# Patient Record
Sex: Male | Born: 1990 | Race: Black or African American | Hispanic: No | Marital: Single | Smoking: Current some day smoker
Health system: Southern US, Community
[De-identification: ages and names within clinical notes are randomized; demographics above are authoritative.]

## PROBLEM LIST (undated history)

## (undated) DIAGNOSIS — Z8614 Personal history of Methicillin resistant Staphylococcus aureus infection: Secondary | ICD-10-CM

## (undated) DIAGNOSIS — M67439 Ganglion, unspecified wrist: Secondary | ICD-10-CM

## (undated) DIAGNOSIS — Z8489 Family history of other specified conditions: Secondary | ICD-10-CM

## (undated) DIAGNOSIS — K219 Gastro-esophageal reflux disease without esophagitis: Secondary | ICD-10-CM

## (undated) HISTORY — PX: WISDOM TOOTH EXTRACTION: SHX21

## (undated) HISTORY — PX: TONSILLECTOMY AND ADENOIDECTOMY: SHX28

---
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
## 2013-06-22 ENCOUNTER — Ambulatory Visit (INDEPENDENT_AMBULATORY_CARE_PROVIDER_SITE_OTHER): Payer: BC Managed Care – PPO | Admitting: Nurse Practitioner

## 2013-06-22 VITALS — Temp 98.4°F | Ht 69.0 in | Wt 158.6 lb

## 2013-06-22 DIAGNOSIS — H6690 Otitis media, unspecified, unspecified ear: Secondary | ICD-10-CM

## 2013-06-22 DIAGNOSIS — J300 Vasomotor rhinitis: Secondary | ICD-10-CM

## 2013-06-22 DIAGNOSIS — H6692 Otitis media, unspecified, left ear: Secondary | ICD-10-CM

## 2013-06-22 DIAGNOSIS — J309 Allergic rhinitis, unspecified: Secondary | ICD-10-CM

## 2013-06-22 MED ORDER — HYDROCODONE-ACETAMINOPHEN 5-325 MG PO TABS: 1.0000 | ORAL_TABLET | ORAL | Status: DC | PRN

## 2013-06-22 MED ORDER — AZITHROMYCIN 250 MG PO TABS: ORAL_TABLET | ORAL | Status: DC

## 2013-06-22 NOTE — Patient Instructions (Signed)
Nasacort AQ as directed OTC antihistamine 

## 2013-06-24 ENCOUNTER — Encounter: Payer: Self-pay | Admitting: Nurse Practitioner

## 2013-06-24 NOTE — Progress Notes (Signed)
Subjective:  Presents complaints of left ear pain for the past 4 days. No fever. No headache cough runny nose or sore throat. Some pressure and popping at times. Worse with laying down and burping. No drainage from the ear. Has not been swimming.  Objective:   Temp(Src) 98.4 F (36.9 C) (Oral)  Ht 5\' 9"  (1.753 m)  Wt 158 lb 9.6 oz (71.94 kg)  BMI 23.41 kg/m2 NAD. Alert, oriented. Right TM mild clear effusion, no erythema. Left TM a small area of erythema noted towards the upper part of the TM, clear fluid for the remainder. Pharynx injected with clear PND noted. Neck supple with mild soft slightly tender adenopathy on the left. Lungs clear. Heart regular rate rhythm.  Assessment: Otitis media, left  Vasomotor rhinitis  Plan: Meds ordered this encounter  Medications  . azithromycin (ZITHROMAX Z-PAK) 250 MG tablet    Sig: Take 2 tablets (500 mg) on  Day 1,  followed by 1 tablet (250 mg) once daily on Days 2 through 5.    Dispense:  6 each    Refill:  0    Order Specific Question:  Supervising Provider    Answer:  Merlyn Albert [[id number]]  . HYDROcodone-acetaminophen (NORCO/VICODIN) 5-325 MG per tablet    Sig: Take 1 tablet by mouth every 4 (four) hours as needed for pain.    Dispense:  20 tablet    Refill:  0    Order Specific Question:  Supervising Provider    Answer:  Merlyn Albert [[id number]]   OTC meds as directed for congestion. Call back by the end of the week if no improvement, sooner if worse.

## 2013-06-25 ENCOUNTER — Encounter: Payer: Self-pay | Admitting: *Deleted

## 2014-12-07 ENCOUNTER — Encounter: Payer: Self-pay | Admitting: Family Medicine

## 2014-12-07 ENCOUNTER — Ambulatory Visit (INDEPENDENT_AMBULATORY_CARE_PROVIDER_SITE_OTHER): Payer: BC Managed Care – PPO | Admitting: Family Medicine

## 2014-12-07 VITALS — BP 112/60 | Temp 98.8°F | Ht 69.0 in | Wt 155.0 lb

## 2014-12-07 DIAGNOSIS — K2970 Gastritis, unspecified, without bleeding: Secondary | ICD-10-CM

## 2014-12-07 DIAGNOSIS — Z111 Encounter for screening for respiratory tuberculosis: Secondary | ICD-10-CM

## 2014-12-07 DIAGNOSIS — K2990 Gastroduodenitis, unspecified, without bleeding: Secondary | ICD-10-CM

## 2014-12-07 LAB — CBC WITH DIFFERENTIAL/PLATELET
Basophils Absolute: 0 10*3/uL (ref 0.0–0.1)
Basophils Relative: 0 % (ref 0–1)
Eosinophils Absolute: 0 10*3/uL (ref 0.0–0.7)
Eosinophils Relative: 0 % (ref 0–5)
HCT: 43.2 % (ref 39.0–52.0)
Hemoglobin: 14.6 g/dL (ref 13.0–17.0)
Lymphocytes Relative: 22 % (ref 12–46)
Lymphs Abs: 2 10*3/uL (ref 0.7–4.0)
MCH: 31.1 pg (ref 26.0–34.0)
MCHC: 33.8 g/dL (ref 30.0–36.0)
MCV: 91.9 fL (ref 78.0–100.0)
MPV: 11.3 fL (ref 8.6–12.4)
Monocytes Absolute: 0.5 10*3/uL (ref 0.1–1.0)
Monocytes Relative: 6 % (ref 3–12)
Neutro Abs: 6.6 10*3/uL (ref 1.7–7.7)
Neutrophils Relative %: 72 % (ref 43–77)
Platelets: 250 10*3/uL (ref 150–400)
RBC: 4.7 MIL/uL (ref 4.22–5.81)
RDW: 14 % (ref 11.5–15.5)
WBC: 9.1 10*3/uL (ref 4.0–10.5)

## 2014-12-07 LAB — HEPATIC FUNCTION PANEL
ALT: 12 U/L (ref 0–53)
AST: 18 U/L (ref 0–37)
Albumin: 4.9 g/dL (ref 3.5–5.2)
Alkaline Phosphatase: 53 U/L (ref 39–117)
Bilirubin, Direct: 0.1 mg/dL (ref 0.0–0.3)
Indirect Bilirubin: 0.5 mg/dL (ref 0.2–1.2)
Total Bilirubin: 0.6 mg/dL (ref 0.2–1.2)
Total Protein: 7.4 g/dL (ref 6.0–8.3)

## 2014-12-07 LAB — LIPASE: Lipase: 10 U/L (ref 0–75)

## 2014-12-07 MED ORDER — PANTOPRAZOLE SODIUM 40 MG PO TBEC: 40.0000 mg | DELAYED_RELEASE_TABLET | Freq: Every day | ORAL | Status: DC

## 2014-12-07 MED ORDER — TRAMADOL HCL 50 MG PO TABS: 50.0000 mg | ORAL_TABLET | Freq: Four times a day (QID) | ORAL | Status: DC | PRN

## 2014-12-07 NOTE — Progress Notes (Signed)
   Subjective:    Patient ID: Luke [name], male    DOB: 01/25/1991, 24 y.o.   MRN: [id number]  Abdominal Pain This is a new problem. The current episode started in the past 7 days. The quality of the pain is burning. The abdominal pain radiates to the epigastric region. Associated symptoms include nausea. Treatments tried: advil, pepto. The treatment provided mild relief.   Reqesting tb test for work. Form on chart. Pt states he only needs TB section filled out.  Patient does wake him up at night with severe pain and discomfort no vomiting no diarrhea with it.  Review of Systems  Gastrointestinal: Positive for nausea and abdominal pain.   Denies diarrhea denies fever chills sweats.    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 Pain medication. If patient not doing dramatically better within the next 7-10 days he is let us know we will set him up with gastroenterology. He may need EGD possibly need CAT scan patient understands all of this

## 2014-12-07 NOTE — Patient Instructions (Signed)

## 2014-12-09 ENCOUNTER — Encounter: Payer: Self-pay | Admitting: Family Medicine

## 2014-12-09 LAB — TB SKIN TEST
Induration: 0 mm
TB Skin Test: NEGATIVE

## 2014-12-09 NOTE — Progress Notes (Signed)
Dr. Lorin PicketScott spoke with this pt.

## 2014-12-10 ENCOUNTER — Ambulatory Visit: Payer: BC Managed Care – PPO | Admitting: Family Medicine

## 2014-12-29 ENCOUNTER — Encounter: Payer: Self-pay | Admitting: Family Medicine

## 2014-12-29 ENCOUNTER — Telehealth: Payer: Self-pay | Admitting: Family Medicine

## 2014-12-29 MED ORDER — ONDANSETRON HCL 8 MG PO TABS: 8.0000 mg | ORAL_TABLET | Freq: Three times a day (TID) | ORAL | Status: DC | PRN

## 2014-12-29 NOTE — Telephone Encounter (Signed)
Pt has been taking tramadol thinking it was a nausea med Mom states he needs some nausea med as per your conversation  With her earlier this week due to food poisoning     [facility]

## 2014-12-29 NOTE — Telephone Encounter (Signed)
Pt is requesting a work note for this week. Pt will be going back hopefully on Friday.

## 2014-12-29 NOTE — Telephone Encounter (Signed)
Zofran 8 mg 1 3 times a day when necessary nausea, #20, 1 refill, work excuse for this week, follow-up if ongoing troubles

## 2014-12-29 NOTE — Telephone Encounter (Signed)
Medication sent to pharmacy. Mom notified. Please prepare work excuse for mom.

## 2014-12-29 NOTE — Telephone Encounter (Signed)
Letter done

## 2014-12-30 ENCOUNTER — Other Ambulatory Visit: Payer: Self-pay | Admitting: *Deleted

## 2014-12-30 ENCOUNTER — Telehealth: Payer: Self-pay | Admitting: Family Medicine

## 2014-12-30 MED ORDER — PROMETHAZINE HCL 25 MG PO TABS: 25.0000 mg | ORAL_TABLET | Freq: Three times a day (TID) | ORAL | Status: DC | PRN

## 2014-12-30 NOTE — Telephone Encounter (Signed)
Discussed with pt. Med sent to pharm. Pt transferred to front to schedule follow up

## 2014-12-30 NOTE — Telephone Encounter (Signed)
Pt called wanting to speak with Dr. Rock NephewPt said his nausea is getting  Worse despite the new meds he was given yesterday. Pt is wanting To know if there is anything else he can do.

## 2014-12-30 NOTE — Telephone Encounter (Signed)
May add phenergan tab 25 mg one q 8 prn, numb 24 rec f u visit with dr Lorin Picketscott next wk

## 2015-01-04 ENCOUNTER — Encounter: Payer: Self-pay | Admitting: Family Medicine

## 2015-01-04 ENCOUNTER — Ambulatory Visit (INDEPENDENT_AMBULATORY_CARE_PROVIDER_SITE_OTHER): Payer: BC Managed Care – PPO | Admitting: Family Medicine

## 2015-01-04 VITALS — BP 110/70 | Ht 69.0 in | Wt 156.1 lb

## 2015-01-04 DIAGNOSIS — R1010 Upper abdominal pain, unspecified: Secondary | ICD-10-CM

## 2015-01-04 DIAGNOSIS — K2970 Gastritis, unspecified, without bleeding: Secondary | ICD-10-CM | POA: Diagnosis not present

## 2015-01-04 DIAGNOSIS — K2990 Gastroduodenitis, unspecified, without bleeding: Secondary | ICD-10-CM

## 2015-01-04 MED ORDER — PANTOPRAZOLE SODIUM 40 MG PO TBEC: 40.0000 mg | DELAYED_RELEASE_TABLET | Freq: Two times a day (BID) | ORAL | Status: DC

## 2015-01-04 NOTE — Progress Notes (Signed)
   Subjective:    Patient ID: Luke [name], male    DOB: [birth year], 24 y.o.   MRN: [id number]  Abdominal Pain This is a new problem. The current episode started 1 to 4 weeks ago. The problem occurs intermittently. The problem has been unchanged. The pain is located in the generalized abdominal region. The pain is at a severity of 8/10. The pain is moderate. The quality of the pain is aching. The abdominal pain does not radiate. Associated symptoms include diarrhea (occasional), nausea and vomiting. Pertinent negatives include no constipation or fever. Nothing aggravates the pain. The pain is relieved by nothing. He has tried proton pump inhibitors for the symptoms. The treatment provided no relief.   Patient states that he has no other concerns at this time.   patient initially started getting better with the PPI but then he states over the past 10 days pain is returned and wakes him up at 4 AM sometimes 5:30 AM his appetite is fair he denies vomiting frequently denies blood in stool.   Review of Systems  Constitutional: Positive for fatigue. Negative for fever and unexpected weight change.  Respiratory: Negative for cough and choking.   Cardiovascular: Negative for chest pain.  Gastrointestinal: Positive for nausea, vomiting, abdominal pain and diarrhea (occasional). Negative for constipation.       Objective:   Physical Exam  Constitutional: He appears well-nourished. No distress.  Cardiovascular: Normal rate, regular rhythm and normal heart sounds.   No murmur heard. Pulmonary/Chest: Effort normal and breath sounds normal. No respiratory distress.  Abdominal: Soft. There is no tenderness.  Musculoskeletal: He exhibits no edema.  Lymphadenopathy:    He has no cervical adenopathy.  Neurological: He is alert.  Psychiatric: His behavior is normal.  Vitals reviewed.         Assessment & Plan:   reoccurring severe abdominal pain with nocturnal pain-this patient needs EGD. If  EGD does not defined what is going on he may well need a CAT scan as well. Referral to gastroenterology urgent referral. Patient understands why. Increase PPI to twice a day.

## 2015-01-05 LAB — SEDIMENTATION RATE: SED RATE: 1 mm/h (ref 0–15)

## 2015-01-05 LAB — TISSUE TRANSGLUTAMINASE, IGA: Tissue Transglutaminase Ab, IgA: 1 U/mL (ref <4)

## 2015-01-06 ENCOUNTER — Ambulatory Visit (INDEPENDENT_AMBULATORY_CARE_PROVIDER_SITE_OTHER): Payer: BC Managed Care – PPO | Admitting: Nurse Practitioner

## 2015-01-06 ENCOUNTER — Other Ambulatory Visit: Payer: Self-pay

## 2015-01-06 ENCOUNTER — Encounter: Payer: Self-pay | Admitting: Nurse Practitioner

## 2015-01-06 VITALS — BP 131/77 | HR 78 | Temp 97.3°F | Ht 69.0 in | Wt 158.0 lb

## 2015-01-06 DIAGNOSIS — R1013 Epigastric pain: Secondary | ICD-10-CM

## 2015-01-06 MED ORDER — SUCRALFATE 1 GM/10ML PO SUSP: 1.0000 g | Freq: Three times a day (TID) | ORAL | Status: DC | PRN

## 2015-01-06 NOTE — Progress Notes (Signed)
cc'ed to pcp °

## 2015-01-06 NOTE — Assessment & Plan Note (Signed)
Approximate 1 month new onset dyspepsia, strong family history of gastritis and ulcers. Previously took moderate to large amounts of ASA powders and/or NSAIDs. Started on PPI daily for 1 month with no significant improvement, required frequent mylanta. PPI doubled to bid yesterday. Will add carafate for severe symptoms and plan for EGD for further evaluation query gastritis/esophagitis/H. Pylori. No red flag/warning symptoms. Follow-up 1 month.

## 2015-01-06 NOTE — Progress Notes (Signed)
Primary Care Physician:  Lilyan PuntLUKING,SCOTT, MD Primary Gastroenterologist:  Dr. Darrick PennaFields  Chief Complaint  Patient presents with  . Follow-up    eval for EGD  . Nausea    HPI:   24 year old male with new onset epigastric pain referred by his PCP. Was trialed on PPI daily with initial improvement but subsequent recurrence. PPI was changed to twice a day and referred for further evaluation of nocturnal epigastric pain that wakens patient at night.  Today he states that his pain is epigastric. He had some brief episodic pain which started 3 weeks ago and became more frequent and longer-lasting. At this point his pain would become severe (7/10) and last for a few days. Pain is described as burning with concurrent nausea. Did vomit the first day, otherwise no emesis just gagging. The pain is bad enough to wake him from his sleep at times and keep him awake. Denies bitter taste, admits frequent belching. Even when he's not having the burning pain he is nauseated. Prior to onset of symptoms was using NSAIDs and ASA powders for headache but has since stopped. States he used to use ASA powder frequently. His grandparents had significant ulcers and his mom has gastritis. Denies hematemesis. Denies hematochezia and melena. Started PPI daily for about 30 days, which was doubled yesterday at his PCP visit. Was taking pepto bismol without relief. Also tried Mylanta which helped but he was needed to take a lot of it. Denies unexplained fever/chills, unintentional weight loss, chest pain, shortness of breath, palpitations, syncope or near syncope. Denies heavy caffeine, carbonated beverage use. Drinks primarily fruit juices. Denies any other upper or lower GI symptoms.    Past Medical History  Diagnosis Date  . Stomach pain     Chronic    Past Surgical History  Procedure Laterality Date  . Tonsillectomy      Current Outpatient Prescriptions  Medication Sig Dispense Refill  . ondansetron (ZOFRAN) 8 MG  tablet Take 1 tablet (8 mg total) by mouth 3 (three) times daily as needed for nausea or vomiting. 20 tablet 1  . pantoprazole (PROTONIX) 40 MG tablet Take 1 tablet (40 mg total) by mouth 2 (two) times daily. 60 tablet 3  . promethazine (PHENERGAN) 25 MG tablet Take 1 tablet (25 mg total) by mouth every 8 (eight) hours as needed for nausea or vomiting. 24 tablet 0  . traMADol (ULTRAM) 50 MG tablet Take 1 tablet (50 mg total) by mouth every 6 (six) hours as needed. 30 tablet 0   No current facility-administered medications for this visit.    Allergies as of 01/06/2015 - Review Complete 01/06/2015  Allergen Reaction Noted  . Penicillins  06/22/2013    No family history on file.  History   Social History  . Marital Status: Single    Spouse Name: N/A  . Number of Children: N/A  . Years of Education: N/A   Occupational History  . Not on file.   Social History Main Topics  . Smoking status: Never Smoker   . Smokeless tobacco: Not on file  . Alcohol Use: Not on file  . Drug Use: Not on file  . Sexual Activity: Not on file   Other Topics Concern  . Not on file   Social History Narrative    Review of Systems: 12 point ROS negative except as per HPI  Physical Exam: BP 131/77 mmHg  Pulse 78  Temp(Src) 97.3 F (36.3 C)  Ht 5\' 9"  (1.753 m)  Wt 158 lb (71.668 kg)  BMI 23.32 kg/m2 General:   Alert and oriented. Pleasant and cooperative. Well-nourished and well-developed.  Head:  Normocephalic and atraumatic. Eyes:  Without icterus, sclera clear and conjunctiva pink.  Ears:  Normal auditory acuity. Mouth:  No deformity or lesions, oral mucosa pink. No OP edema Neck:  Supple, without mass or thyromegaly. Lungs:  Clear to auscultation bilaterally. No wheezes, rales, or rhonchi. No distress.  Heart:  S1, S2 present without murmurs appreciated.  Abdomen:  +BS, soft, non-tender and non-distended. No HSM noted. No guarding or rebound. No masses appreciated.  Rectal:  Deferred    Msk:  Symmetrical without gross deformities. Normal posture. Extremities:  Without clubbing or edema. Neurologic:  Alert and  oriented x4;  grossly normal neurologically. Skin:  Intact without significant lesions or rashes. Cervical Nodes:  No significant cervical adenopathy. Psych:  Alert and cooperative. Normal mood and affect.     01/06/2015 8:30 AM

## 2015-01-06 NOTE — Patient Instructions (Addendum)
Continue taking the protonix  Take the carafate liquid 1 gm three times daily as needed for severe symptoms  We will schedule your endoscopy for you  Further recommendations to be based on the results of your procedure  Follow-up in 1 month   Gastroesophageal Reflux Disease, Adult Gastroesophageal reflux disease (GERD) happens when acid from your stomach flows up into the esophagus. When acid comes in contact with the esophagus, the acid causes soreness (inflammation) in the esophagus. Over time, GERD may create small holes (ulcers) in the lining of the esophagus. CAUSES   Increased body weight. This puts pressure on the stomach, making acid rise from the stomach into the esophagus.  Smoking. This increases acid production in the stomach.  Drinking alcohol. This causes decreased pressure in the lower esophageal sphincter (valve or ring of muscle between the esophagus and stomach), allowing acid from the stomach into the esophagus.  Late evening meals and a full stomach. This increases pressure and acid production in the stomach.  A malformed lower esophageal sphincter. Sometimes, no cause is found. SYMPTOMS   Burning pain in the lower part of the mid-chest behind the breastbone and in the mid-stomach area. This may occur twice a week or more often.  Trouble swallowing.  Sore throat.  Dry cough.  Asthma-like symptoms including chest tightness, shortness of breath, or wheezing. DIAGNOSIS  Your caregiver may be able to diagnose GERD based on your symptoms. In some cases, X-rays and other tests may be done to check for complications or to check the condition of your stomach and esophagus. TREATMENT  Your caregiver may recommend over-the-counter or prescription medicines to help decrease acid production. Ask your caregiver before starting or adding any new medicines.  HOME CARE INSTRUCTIONS   Change the factors that you can control. Ask your caregiver for guidance concerning  weight loss, quitting smoking, and alcohol consumption.  Avoid foods and drinks that make your symptoms worse, such as:  Caffeine or alcoholic drinks.  Chocolate.  Peppermint or mint flavorings.  Garlic and onions.  Spicy foods.  Citrus fruits, such as oranges, lemons, or limes.  Tomato-based foods such as sauce, chili, salsa, and pizza.  Fried and fatty foods.  Avoid lying down for the 3 hours prior to your bedtime or prior to taking a nap.  Eat small, frequent meals instead of large meals.  Wear loose-fitting clothing. Do not wear anything tight around your waist that causes pressure on your stomach.  Raise the head of your bed 6 to 8 inches with wood blocks to help you sleep. Extra pillows will not help.  Only take over-the-counter or prescription medicines for pain, discomfort, or fever as directed by your caregiver.  Do not take aspirin, ibuprofen, or other nonsteroidal anti-inflammatory drugs (NSAIDs). SEEK IMMEDIATE MEDICAL CARE IF:   You have pain in your arms, neck, jaw, teeth, or back.  Your pain increases or changes in intensity or duration.  You develop nausea, vomiting, or sweating (diaphoresis).  You develop shortness of breath, or you faint.  Your vomit is green, yellow, black, or looks like coffee grounds or blood.  Your stool is red, bloody, or black. These symptoms could be signs of other problems, such as heart disease, gastric bleeding, or esophageal bleeding. MAKE SURE YOU:   Understand these instructions.  Will watch your condition.  Will get help right away if you are not doing well or get worse. Document Released: 08/01/2005 Document Revised: 01/14/2012 Document Reviewed: 05/11/2011 ExitCare Patient Information 2015  [employer]. This information is not intended to replace advice given to you by your health care provider. Make sure you discuss any questions you have with your health care provider.   Food Choices for Gastroesophageal  Reflux Disease When you have gastroesophageal reflux disease (GERD), the foods you eat and your eating habits are very important. Choosing the right foods can help ease the discomfort of GERD. WHAT GENERAL GUIDELINES DO I NEED TO FOLLOW?  Choose fruits, vegetables, whole grains, low-fat dairy products, and low-fat meat, fish, and poultry.  Limit fats such as oils, salad dressings, butter, nuts, and avocado.  Keep a food diary to identify foods that cause symptoms.  Avoid foods that cause reflux. These may be different for different people.  Eat frequent small meals instead of three large meals each day.  Eat your meals slowly, in a relaxed setting.  Limit fried foods.  Cook foods using methods other than frying.  Avoid drinking alcohol.  Avoid drinking large amounts of liquids with your meals.  Avoid bending over or lying down until 2-3 hours after eating. WHAT FOODS ARE NOT RECOMMENDED? The following are some foods and drinks that may worsen your symptoms: Vegetables Tomatoes. Tomato juice. Tomato and spaghetti sauce. Chili peppers. Onion and garlic. Horseradish. Fruits Oranges, grapefruit, and lemon (fruit and juice). Meats High-fat meats, fish, and poultry. This includes hot dogs, ribs, ham, sausage, salami, and bacon. Dairy Whole milk and chocolate milk. Sour cream. Cream. Butter. Ice cream. Cream cheese.  Beverages Coffee and tea, with or without caffeine. Carbonated beverages or energy drinks. Condiments Hot sauce. Barbecue sauce.  Sweets/Desserts Chocolate and cocoa. Donuts. Peppermint and spearmint. Fats and Oils High-fat foods, including [place] fries and potato chips. Other Vinegar. Strong spices, such as black pepper, white pepper, red pepper, cayenne, curry powder, cloves, ginger, and chili powder. The items listed above may not be a complete list of foods and beverages to avoid. Contact your dietitian for more information. Document Released: 10/22/2005  Document Revised: 10/27/2013 Document Reviewed: 08/26/2013 [facility] Patient Information 2015 [place], [place]. This information is not intended to replace advice given to you by your health care provider. Make sure you discuss any questions you have with your health care provider.

## 2015-01-11 ENCOUNTER — Encounter (HOSPITAL_COMMUNITY)
Admission: RE | Disposition: A | Discharge status: Home / Self Care | Payer: Self-pay | Source: Ambulatory Visit | Attending: Gastroenterology

## 2015-01-11 ENCOUNTER — Encounter (HOSPITAL_COMMUNITY): Payer: Self-pay | Admitting: *Deleted

## 2015-01-11 ENCOUNTER — Ambulatory Visit (HOSPITAL_COMMUNITY)
Admission: RE | Admit: 2015-01-11 | Discharge: 2015-01-11 | Disposition: A | Discharge status: Home / Self Care | Payer: BC Managed Care – PPO | Source: Ambulatory Visit | Attending: Gastroenterology | Admitting: Gastroenterology

## 2015-01-11 DIAGNOSIS — K2970 Gastritis, unspecified, without bleeding: Secondary | ICD-10-CM | POA: Insufficient documentation

## 2015-01-11 DIAGNOSIS — R1013 Epigastric pain: Secondary | ICD-10-CM | POA: Diagnosis present

## 2015-01-11 HISTORY — PX: ESOPHAGOGASTRODUODENOSCOPY: SHX5428

## 2015-01-11 SURGERY — EGD (ESOPHAGOGASTRODUODENOSCOPY)
Anesthesia: Moderate Sedation

## 2015-01-11 MED ORDER — SODIUM CHLORIDE 0.9 % IV SOLN
INTRAVENOUS | Status: DC
  Administered 2015-01-11: 1000 mL via INTRAVENOUS

## 2015-01-11 MED ORDER — MEPERIDINE HCL 100 MG/ML IJ SOLN
INTRAMUSCULAR | Status: AC
  Filled 2015-01-11: qty 2

## 2015-01-11 MED ORDER — MIDAZOLAM HCL 5 MG/5ML IJ SOLN
INTRAMUSCULAR | Status: AC
  Filled 2015-01-11: qty 10

## 2015-01-11 MED ORDER — MEPERIDINE HCL 100 MG/ML IJ SOLN
INTRAMUSCULAR | Status: DC | PRN
  Administered 2015-01-11: 25 mg via INTRAVENOUS
  Administered 2015-01-11: 50 mg via INTRAVENOUS
  Administered 2015-01-11: 25 mg via INTRAVENOUS

## 2015-01-11 MED ORDER — MIDAZOLAM HCL 5 MG/5ML IJ SOLN
INTRAMUSCULAR | Status: DC | PRN
  Administered 2015-01-11 (×4): 2 mg via INTRAVENOUS

## 2015-01-11 MED ORDER — SODIUM CHLORIDE 0.9 % IJ SOLN
INTRAMUSCULAR | Status: AC
  Filled 2015-01-11: qty 3

## 2015-01-11 MED ORDER — PROMETHAZINE HCL 25 MG/ML IJ SOLN
INTRAMUSCULAR | Status: AC
  Filled 2015-01-11: qty 1

## 2015-01-11 MED ORDER — STERILE WATER FOR IRRIGATION IR SOLN
Status: DC | PRN
  Administered 2015-01-11: 14:00:00

## 2015-01-11 MED ORDER — LIDOCAINE VISCOUS 2 % MT SOLN
OROMUCOSAL | Status: AC
  Filled 2015-01-11: qty 15

## 2015-01-11 MED ORDER — PROMETHAZINE HCL 25 MG/ML IJ SOLN
12.5000 mg | Freq: Once | INTRAMUSCULAR | Status: AC
  Administered 2015-01-11: 12.5 mg via INTRAVENOUS

## 2015-01-11 NOTE — H&P (Signed)
  Primary Care Physician:  Lilyan PuntLUKING,SCOTT, MD Primary Gastroenterologist:  Dr. Darrick PennaFields  Pre-Procedure History & Physical: HPI:  Luke [name] is a 24 y.o. male here for DYSPEPSIA.  Past Medical History  Diagnosis Date  . Stomach pain     Chronic    Past Surgical History  Procedure Laterality Date  . Tonsillectomy    . Wisdom tooth extraction      Prior to Admission medications   Medication Sig Start Date End Date Taking? Authorizing Provider  ondansetron (ZOFRAN) 8 MG tablet Take 1 tablet (8 mg total) by mouth 3 (three) times daily as needed for nausea or vomiting. 12/29/14  Yes Babs SciaraScott A Luking, MD  pantoprazole (PROTONIX) 40 MG tablet Take 1 tablet (40 mg total) by mouth 2 (two) times daily. 01/04/15  Yes Babs SciaraScott A Luking, MD  promethazine (PHENERGAN) 25 MG tablet Take 1 tablet (25 mg total) by mouth every 8 (eight) hours as needed for nausea or vomiting. 12/30/14  Yes Merlyn AlbertWilliam S Luking, MD  sucralfate (CARAFATE) 1 GM/10ML suspension Take 10 mLs (1 g total) by mouth 3 (three) times daily as needed. 01/06/15  Yes Anice PaganiniEric A Gill, NP  traMADol (ULTRAM) 50 MG tablet Take 1 tablet (50 mg total) by mouth every 6 (six) hours as needed. 12/07/14  Yes Babs SciaraScott A Luking, MD    Allergies as of 01/06/2015 - Review Complete 01/06/2015  Allergen Reaction Noted  . Penicillins  06/22/2013    Family History  Problem Relation Age of Onset  . Ulcers Maternal Grandfather   . Ulcers Maternal Grandmother   . Esophagitis Mother     History   Social History  . Marital Status: Single    Spouse Name: N/A  . Number of Children: N/A  . Years of Education: N/A   Occupational History  . Not on file.   Social History Main Topics  . Smoking status: Never Smoker   . Smokeless tobacco: Not on file  . Alcohol Use: No  . Drug Use: No  . Sexual Activity: Not on file   Other Topics Concern  . Not on file   Social History Narrative    Review of Systems: See HPI, otherwise negative ROS   Physical Exam: BP  111/67 mmHg  Pulse 71  Temp(Src) 98 F (36.7 C)  Resp 16  SpO2 99% General:   Alert,  pleasant and cooperative in NAD Head:  Normocephalic and atraumatic. Neck:  Supple; Lungs:  Clear throughout to auscultation.    Heart:  Regular rate and rhythm. Abdomen:  Soft, nontender and nondistended. Normal bowel sounds, without guarding, and without rebound.   Neurologic:  Alert and  oriented x4;  grossly normal neurologically.  Impression/Plan:   DYSPEPSIA  PLAN:  EGD TODAY

## 2015-01-11 NOTE — Op Note (Signed)
[facility] [address] [place], [id number]   ENDOSCOPY PROCEDURE REPORT  PATIENT: Luke [name], Luke [name]  MR#: [id number] BIRTHDATE: [birth year] , 23  yrs. old GENDER: male  ENDOSCOPIST: West BaliSandi L Taden Witter, MD REFERRED GN:FAOZHBY:Scott Gerda DissLuking, M.D. PROCEDURE DATE: 01/11/2015 PROCEDURE:   EGD w/ biopsy  INDICATIONS:dyspepsia. USED TO USE AS PRODUCTS. NOW TAKING PROTONIX. HELPS BUT Sx NOT RESOLVED. MEDICATIONS: Demerol 100 mg IV, Versed 8 mg IV, and Promethazine (Phenergan) 12.5 mg IV TOPICAL ANESTHETIC:   Viscous Xylocaine ASA CLASS:  DESCRIPTION OF PROCEDURE:     Physical exam was performed.  Informed consent was obtained from the patient after explaining the benefits, risks, and alternatives to the procedure.  The patient was connected to the monitor and placed in the left lateral position.  Continuous oxygen was provided by nasal cannula and IV medicine administered through an indwelling cannula.  After administration of sedation, the patients esophagus was intubated and the EG-[id number] ([id number]  endoscope was advanced under direct visualization to the second portion of the duodenum.  The scope was removed slowly by carefully examining the color, texture, anatomy, and integrity of the mucosa on the way out.  The patient was recovered in endoscopy and discharged home in satisfactory condition.   ESOPHAGUS: The mucosa of the esophagus appeared normal.   STOMACH: Mild erosive gastritis (inflammation) was found in the gastric antrum.  Multiple biopsies were performed using cold forceps. DUODENUM: The duodenal mucosa showed no abnormalities in the bulb and 2nd part of the duodenum.  Cold forceps biopsies were taken in the bulb and second portion. COMPLICATIONS: There were no immediate complications.  ENDOSCOPIC IMPRESSION: 1.   NAUSEA AND EPIGASTRIC PAIN MOST LIKELY DUE TO GERD/GASTRITIS 2.   MILD Erosive gastritis  RECOMMENDATIONS: CONTINUE PROTONIX.  TAKE 30 MINUTES  PRIOR TO BREAKFAST. FOLLOW [name] LOW FAT DIET. AWAIT BIOPSY FOLLOW UP IN MAY 2016.  REPEAT EXAM:   _______________________________ eSignedWest Bali:  Placido Hangartner L Jodee Wagenaar, MD 01/11/2015 3:16 PM     CPT CODES: ICD CODES:  The ICD and CPT codes recommended by this software are interpretations from the data that the clinical staff has captured with the software.  The verification of the translation of this report to the ICD and CPT codes and modifiers is the sole responsibility of the health care institution and practicing physician where this report was generated.  [employer] will not be held responsible for the validity of the ICD and CPT codes included on this report.  AMA assumes no liability for data contained or not contained herein. CPT is [name] rights managerregistered trademark of the [employer].

## 2015-01-11 NOTE — Discharge Instructions (Signed)
YOUR NAUSEA AND ABDOMINAL PAIN ARE MOST LIKELY DUE TO GASTRITIS AND REFLUX. Your gastritis is most likely DUE TO YOUR USING ASPIRIN PRODUCTS. I biopsied your stomach & DUODENUM.   CONTINUE PROTONIX. TAKE 30 MINUTES PRIOR TO BREAKFAST.  FOLLOW A LOW FAT DIET. SEE INFO BELOW.  YOUR BIOPSY RESULTS WILL BE AVAILABLE IN MY CHART AFTER MAR 11  OR MY OFFICE WILL CONTACT YOU IN 10-14 DAYS WITH YOUR RESULTS.   FOLLOW UP IN MAY 2016.   UPPER ENDOSCOPY AFTER CARE Read the instructions outlined below and refer to this sheet in the next week. These discharge instructions provide you with general information on caring for yourself after you leave the hospital. While your treatment has been planned according to the most current medical practices available, unavoidable complications occasionally occur. If you have any problems or questions after discharge, call DR. Dewel Lotter, [phone].  ACTIVITY  You may resume your regular activity, but move at a slower pace for the next 24 hours.   Take frequent rest periods for the next 24 hours.   Walking will help get rid of the air and reduce the bloated feeling in your belly (abdomen).   No driving for 24 hours (because of the medicine (anesthesia) used during the test).   You may shower.   Do not sign any important legal documents or operate any machinery for 24 hours (because of the anesthesia used during the test).    NUTRITION  Drink plenty of fluids.   You may resume your normal diet as instructed by your doctor.   Begin with a light meal and progress to your normal diet. Heavy or fried foods are harder to digest and may make you feel sick to your stomach (nauseated).   Avoid alcoholic beverages for 24 hours or as instructed.    MEDICATIONS  You may resume your normal medications.   WHAT YOU CAN EXPECT TODAY  Some feelings of bloating in the abdomen.   Passage of more gas than usual.    IF YOU HAD A BIOPSY TAKEN DURING THE UPPER  ENDOSCOPY:  Eat a soft diet IF YOU HAVE NAUSEA, BLOATING, ABDOMINAL PAIN, OR VOMITING.    FINDING OUT THE RESULTS OF YOUR TEST Not all test results are available during your visit. DR. Darrick Penna WILL CALL YOU WITHIN 14 DAYS OF YOUR PROCEDUE WITH YOUR RESULTS. Do not assume everything is normal if you have not heard from DR. Vonna Brabson, CALL HER OFFICE AT [phone].  SEEK IMMEDIATE MEDICAL ATTENTION AND CALL THE OFFICE: [phone] IF:  You have more than a spotting of blood in your stool.   Your belly is swollen (abdominal distention).   You are nauseated or vomiting.   You have a temperature over 101F.   You have abdominal pain or discomfort that is severe or gets worse throughout the day.   Gastritis  Gastritis is an inflammation (the body's way of reacting to injury and/or infection) of the stomach.  It is often caused by bacterial (germ) infections. It can also be caused BY ASPIRIN, BC/GOODY POWDER'S, (IBUPROFEN) MOTRIN, OR ALEVE (NAPROXEN), chemicals (including alcohol), SPICY FOODS, and medications. This illness may be associated with generalized malaise (feeling tired, not well), UPPER ABDOMINAL STOMACH cramps, and fever. One common bacterial cause of gastritis is an organism known as H. Pylori. This can be treated with antibiotics.     REFLUX   TREATMENT There are a number of medicines used to treat reflux including: Antacids.  ZANTAC Proton-pump inhibitors: PROTONIX  HOME  CARE INSTRUCTIONS Eat 2-3 hours before going to bed.  Try to reach and maintain a healthy weight. LOSE 10-20 LBS Do not eat just a few very large meals. Instead, eat 4 TO 6 smaller meals throughout the day.  Try to identify foods and beverages that make your symptoms worse, and avoid these.  Avoid tight clothing.  Do not exercise right after eating.   Low-Fat Diet BREADS, CEREALS, PASTA, RICE, DRIED PEAS, AND BEANS These products are high in carbohydrates and most are low in fat. Therefore, they  can be increased in the diet as substitutes for fatty foods. They too, however, contain calories and should not be eaten in excess. Cereals can be eaten for snacks as well as for breakfast.  Include foods that contain fiber (fruits, vegetables, whole grains, and legumes). Research shows that fiber may lower blood cholesterol levels, especially the water-soluble fiber found in fruits, vegetables, oat products, and legumes. FRUITS AND VEGETABLES It is good to eat fruits and vegetables. Besides being sources of fiber, both are rich in vitamins and some minerals. They help you get the daily allowances of these nutrients. Fruits and vegetables can be used for snacks and desserts. MEATS Limit lean meat, chicken, [place], and fish to no more than 6 ounces per day. Beef, Pork, and Lamb Use lean cuts of beef, pork, and lamb. Lean cuts include:  Extra-lean ground beef.  Arm roast.  Sirloin tip.  Center-cut ham.  Round steak.  Loin chops.  Rump roast.  Tenderloin.  Trim all fat off the outside of meats before cooking. It is not necessary to severely decrease the intake of red meat, but lean choices should be made. Lean meat is rich in protein and contains a highly absorbable form of iron. Premenopausal women, in particular, should avoid reducing lean red meat because this could increase the risk for low red blood cells (iron-deficiency anemia).  Chicken and [place] These are good sources of protein. The fat of poultry can be reduced by removing the skin and underlying fat layers before cooking. Chicken and [place] can be substituted for lean red meat in the diet. Poultry should not be fried or covered with high-fat sauces. Fish and Shellfish Fish is a good source of protein. Shellfish contain cholesterol, but they usually are low in saturated fatty acids. The preparation of fish is important. Like chicken and [place], they should not be fried or covered with high-fat sauces. EGGS Egg whites contain no fat or  cholesterol. They can be eaten often. Try 1 to 2 egg whites instead of whole eggs in recipes or use egg substitutes that do not contain yolk.  MILK AND DAIRY PRODUCTS Use skim or 1% milk instead of 2% or whole milk. Decrease whole milk, natural, and processed cheeses. Use nonfat or low-fat (2%) cottage cheese or low-fat cheeses made from vegetable oils. Choose nonfat or low-fat (1 to 2%) yogurt. Experiment with evaporated skim milk in recipes that call for heavy cream. Substitute low-fat yogurt or low-fat cottage cheese for sour cream in dips and salad dressings. Have at least 2 servings of low-fat dairy products, such as 2 glasses of skim (or 1%) milk each day to help get your daily calcium intake.  FATS AND OILS Butterfat, lard, and beef fats are high in saturated fat and cholesterol. These should be avoided.Vegetable fats do not contain cholesterol. AVOID coconut oil, palm oil, and palm kernel oil, WHICH are very high in saturated fats. These should be limited. These fats are often  used in [employer] goods, processed foods, popcorn, oils, and nondairy creamers. Vegetable shortenings and some peanut butters contain hydrogenated oils, which are also saturated fats. Read the labels on these foods and check for saturated vegetable oils.  Desirable liquid vegetable oils are corn oil, cottonseed oil, olive oil, canola oil, safflower oil, soybean oil, and sunflower oil. Peanut oil is not as good, but small amounts are acceptable. Buy a heart-healthy tub margarine that has no partially hydrogenated oils in the ingredients. AVOID Mayonnaise and salad dressings often are made from unsaturated fats.  OTHER EATING TIPS Snacks  Most sweets should be limited as snacks. They tend to be rich in calories and fats, and their caloric content outweighs their nutritional value. Some good choices in snacks are graham crackers, melba toast, soda crackers, bagels (no egg), English muffins, fruits, and vegetables. These snacks  are preferable to snack crackers, [place] fries, and chips. Popcorn should be air-popped or cooked in small amounts of liquid vegetable oil.  Desserts Eat fruit, low-fat yogurt, and fruit ices instead of pastries, cake, and cookies. Sherbet, angel food cake, gelatin dessert, frozen low-fat yogurt, or other frozen products that do not contain saturated fat (pure fruit juice bars, frozen ice pops) are also acceptable.   COOKING METHODS Choose those methods that use little or no fat. They include: Poaching.  Braising.  Steaming.  Grilling.  Baking.  Stir-frying.  Broiling.  Microwaving.  Foods can be cooked in a nonstick pan without added fat, or use a nonfat cooking spray in regular cookware. Limit fried foods and avoid frying in saturated fat. Add moisture to lean meats by using water, broth, cooking wines, and other nonfat or low-fat sauces along with the cooking methods mentioned above. Soups and stews should be chilled after cooking. The fat that forms on top after a few hours in the refrigerator should be skimmed off. When preparing meals, avoid using excess salt. Salt can contribute to raising blood pressure in some people.  EATING AWAY FROM HOME Order entres, potatoes, and vegetables without sauces or butter. When meat exceeds the size of a deck of cards (3 to 4 ounces), the rest can be taken home for another meal. Choose vegetable or fruit salads and ask for low-calorie salad dressings to be served on the side. Use dressings sparingly. Limit high-fat toppings, such as bacon, crumbled eggs, cheese, sunflower seeds, and olives. Ask for heart-healthy tub margarine instead of butter.

## 2015-01-11 NOTE — Progress Notes (Signed)
REVIEWED-NO ADDITIONAL RECOMMENDATIONS. 

## 2015-01-12 ENCOUNTER — Encounter (HOSPITAL_COMMUNITY): Payer: Self-pay | Admitting: Gastroenterology

## 2015-01-26 ENCOUNTER — Telehealth: Payer: Self-pay | Admitting: Gastroenterology

## 2015-01-26 NOTE — Telephone Encounter (Signed)
Please call pt. His stomach Bx shows gastritis.Marland Kitchen. HIS SMALL BOWEL BIOPSIES ARE NORMAL. HIS  NAUSEA AND ABDOMINAL PAIN ARE MOST LIKELY DUE TO GASTRITIS AND REFLUX. HIS gastritis is most likely DUE TO YOUR USING ASPIRIN PRODUCTS. I   CONTINUE PROTONIX. TAKE 30 MINUTES PRIOR TO BREAKFAST.  FOLLOW A LOW FAT DIET.   IF HIS SYMPTOMS HAVE NOT COMPLETELY RESOLVED, HE NEEDS A CT ABD W/ IVC, Dx: NAUSEA, EPIGASTRIC PAIN.  FOLLOW UP IN MAY 2016 E30 NAUSEA/ABDOMINAL PAIN..Marland Kitchen

## 2015-01-27 NOTE — Telephone Encounter (Signed)
LM for pt to call

## 2015-01-27 NOTE — Telephone Encounter (Signed)
OV made °

## 2015-01-31 ENCOUNTER — Telehealth: Payer: Self-pay | Admitting: Gastroenterology

## 2015-01-31 NOTE — Telephone Encounter (Signed)
Pt had left VM and I returned his call and LMOM for a call back.

## 2015-01-31 NOTE — Telephone Encounter (Signed)
Pt is aware of results. Luke [name] he is feeling much better. Will call if he has problems.

## 2015-01-31 NOTE — Telephone Encounter (Signed)
[phone] PATIENT RETURNED CALL, PLEASE CALL BACK

## 2015-01-31 NOTE — Telephone Encounter (Signed)
See results. Pt is aware.  

## 2015-02-10 ENCOUNTER — Ambulatory Visit: Payer: BC Managed Care – PPO | Admitting: Nurse Practitioner

## 2015-03-02 ENCOUNTER — Encounter: Payer: Self-pay | Admitting: Family Medicine

## 2015-03-02 ENCOUNTER — Ambulatory Visit (INDEPENDENT_AMBULATORY_CARE_PROVIDER_SITE_OTHER): Payer: BC Managed Care – PPO | Admitting: Family Medicine

## 2015-03-02 VITALS — BP 122/78 | Temp 99.0°F | Ht 69.0 in | Wt 154.5 lb

## 2015-03-02 DIAGNOSIS — J019 Acute sinusitis, unspecified: Secondary | ICD-10-CM

## 2015-03-02 DIAGNOSIS — R6889 Other general symptoms and signs: Secondary | ICD-10-CM

## 2015-03-02 DIAGNOSIS — B9689 Other specified bacterial agents as the cause of diseases classified elsewhere: Secondary | ICD-10-CM

## 2015-03-02 MED ORDER — AZITHROMYCIN 250 MG PO TABS: ORAL_TABLET | ORAL | Status: DC

## 2015-03-02 MED ORDER — HYDROCODONE-HOMATROPINE 5-1.5 MG/5ML PO SYRP: 5.0000 mL | ORAL_SOLUTION | Freq: Four times a day (QID) | ORAL | Status: DC | PRN

## 2015-03-02 NOTE — Progress Notes (Signed)
   Subjective:    Patient ID: Luke [name], male    DOB: [birth year], 24 y.o.   MRN: [id number]  Fever  This is a new problem. The current episode started in the past 7 days. The problem occurs intermittently. The problem has been unchanged. The maximum temperature noted was 99 to 99.9 F. Associated symptoms include congestion, coughing, headaches and a sore throat. He has tried acetaminophen (nasacort) for the symptoms. The treatment provided no relief.   Patient states that he has no other concerns at this time.  Patient relates head congestion sinus pressure symptoms over the past several days  Review of Systems  Constitutional: Positive for fever.  HENT: Positive for congestion and sore throat.   Respiratory: Positive for cough.   Neurological: Positive for headaches.       Objective:   Physical Exam  Constitutional: He appears well-developed.  HENT:  Head: Normocephalic.  Mouth/Throat: Oropharynx is clear and moist. No oropharyngeal exudate.  Neck: Normal range of motion.  Cardiovascular: Normal rate, regular rhythm and normal heart sounds.   No murmur heard. Pulmonary/Chest: Effort normal and breath sounds normal. He has no wheezes.  Lymphadenopathy:    He has no cervical adenopathy.  Neurological: He exhibits normal muscle tone.  Skin: Skin is warm and dry.  Nursing note and vitals reviewed.         Assessment & Plan:  Febrile illness Viral syndrome Flulike illness Secondary sinusitis Antibiotics prescribed Cough medication for home use at nighttime use only Work excuse given Follow-up if progressive troubles warning signs discussed

## 2015-03-08 ENCOUNTER — Telehealth: Payer: Self-pay

## 2015-03-08 NOTE — Telephone Encounter (Signed)
VM from [facility] in reference to the pt's Protonix. Dr. Gerda DissLuking had prescribed bid and Dr. Evelina DunField's said in opt note to continue daily. Please advise should pt have it qd or bid.

## 2015-03-09 ENCOUNTER — Telehealth: Payer: Self-pay | Admitting: Family Medicine

## 2015-03-09 DIAGNOSIS — K2970 Gastritis, unspecified, without bleeding: Secondary | ICD-10-CM

## 2015-03-09 DIAGNOSIS — K2990 Gastroduodenitis, unspecified, without bleeding: Principal | ICD-10-CM

## 2015-03-09 NOTE — Telephone Encounter (Signed)
PLEASE CALL PT. IF HIS ABDOMINAL PAIN AND NAUSEA ARE CONTROLLED ON ONCE DAILY PROTONIX THEN HE SHOULD REMAIN ON THAT DOSE.

## 2015-03-09 NOTE — Telephone Encounter (Signed)
Nurses not sure if last message went through thank you

## 2015-03-09 NOTE — Telephone Encounter (Signed)
Prior authorization request received from [facility] for pt's pantoprazole (PROTONIX) 40 MG tablet at BID dosing   Pt has been seen by Dr. Darrick PennaFields and after pt's procedure, Dr. Darrick PennaFields has recommended once daily dosing   Please advise, not sure what I should with this request.  Dr. Lorin PicketScott increased dose to BID & referred to Dr. Darrick PennaFields, Dr. Darrick PennaFields did procedure and recommended once daily dose.  Just need to know what to do to get patient his medicine.    If once daily is what he should be taking, please change order to reflect that and send to pharmacy (once daily dose may not need prior authorization)

## 2015-03-09 NOTE — Telephone Encounter (Signed)
LMOM for [facility] the message from Dr. Darrick PennaFields.

## 2015-03-09 NOTE — Telephone Encounter (Signed)
Nurses please put in the medication for once daily dosing. This seems appropriate since it is controlling his symptoms and Dr. Darrick Pennafields as stated that once daily dosing would be fine. May do 6 months and refill.

## 2015-03-10 MED ORDER — PANTOPRAZOLE SODIUM 40 MG PO TBEC: 40.0000 mg | DELAYED_RELEASE_TABLET | Freq: Every day | ORAL | Status: DC

## 2015-03-10 NOTE — Telephone Encounter (Signed)
Done

## 2015-03-15 ENCOUNTER — Telehealth: Payer: Self-pay | Admitting: Family Medicine

## 2015-03-15 NOTE — Telephone Encounter (Signed)
Rx prior auth approved for pt's pantoprazole (PROTONIX) 40 MG tablet, approved for 1 daily dose, vaild 02/08/15-03/09/16, case id# [id number]

## 2015-03-21 ENCOUNTER — Ambulatory Visit: Payer: BC Managed Care – PPO | Admitting: Nurse Practitioner

## 2015-05-27 ENCOUNTER — Emergency Department (HOSPITAL_COMMUNITY)
Admission: EM | Admit: 2015-05-27 | Discharge: 2015-05-27 | Disposition: A | Discharge status: Home / Self Care | Payer: BC Managed Care – PPO | Attending: Emergency Medicine | Admitting: Emergency Medicine

## 2015-05-27 ENCOUNTER — Encounter (HOSPITAL_COMMUNITY): Payer: Self-pay | Admitting: Emergency Medicine

## 2015-05-27 DIAGNOSIS — Z79899 Other long term (current) drug therapy: Secondary | ICD-10-CM | POA: Diagnosis not present

## 2015-05-27 DIAGNOSIS — Y9389 Activity, other specified: Secondary | ICD-10-CM | POA: Diagnosis not present

## 2015-05-27 DIAGNOSIS — S01111A Laceration without foreign body of right eyelid and periocular area, initial encounter: Secondary | ICD-10-CM | POA: Insufficient documentation

## 2015-05-27 DIAGNOSIS — Y9289 Other specified places as the place of occurrence of the external cause: Secondary | ICD-10-CM | POA: Insufficient documentation

## 2015-05-27 DIAGNOSIS — Z880 Allergy status to penicillin: Secondary | ICD-10-CM | POA: Diagnosis not present

## 2015-05-27 DIAGNOSIS — G8929 Other chronic pain: Secondary | ICD-10-CM | POA: Diagnosis not present

## 2015-05-27 DIAGNOSIS — Y998 Other external cause status: Secondary | ICD-10-CM | POA: Diagnosis not present

## 2015-05-27 DIAGNOSIS — W2209XA Striking against other stationary object, initial encounter: Secondary | ICD-10-CM | POA: Diagnosis not present

## 2015-05-27 MED ORDER — LIDOCAINE HCL (PF) 1 % IJ SOLN
5.0000 mL | Freq: Once | INTRAMUSCULAR | Status: AC
  Administered 2015-05-27: 5 mL via INTRADERMAL
  Filled 2015-05-27: qty 5

## 2015-05-27 NOTE — ED Notes (Signed)
Pt. Stated, was horse playing with kids and hit the wall.. Laceration to rt. Eyebrow. 1/2 inch.

## 2015-05-27 NOTE — ED Provider Notes (Signed)
CSN: [id number]     Arrival date & time 05/27/15  1120 History  This chart was scribed for non-physician practitioner, Garlon Hatchet, PA-C working with Doug Sou, MD by Placido Sou, ED scribe. This patient was seen in room TR07C/TR07C and the patient's care was started at 12:47 PM.  Chief Complaint  Patient presents with  . Facial Laceration   The history is provided by the patient. No language interpreter was used.   HPI Comments: Luke [name] is a 24 y.o. male who presents to the Emergency Department complaining of a laceration above his right eye with onset earlier today. 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 No LOC.  He confirms being UTD on his tetanus vaccination. Pt denies any other associated symptoms.  Past Medical History  Diagnosis Date  . Stomach pain     Chronic   Past Surgical History  Procedure Laterality Date  . Tonsillectomy    . Wisdom tooth extraction    . Esophagogastroduodenoscopy N/A 01/11/2015    SLF: 1. nausea and epigastric pain most likely due to GERD/ Gastritis 2. Mild Erosive gastritis   Family History  Problem Relation Age of Onset  . Ulcers Maternal Grandfather   . Ulcers Maternal Grandmother   . Esophagitis Mother    History  Substance Use Topics  . Smoking status: Never Smoker   . Smokeless tobacco: Not on file  . Alcohol Use: No    Review of Systems  Skin: Positive for wound.  All other systems reviewed and are negative.   Allergies  Penicillins  Home Medications   Prior to Admission medications   Medication Sig Start Date End Date Taking? Authorizing Provider  azithromycin (ZITHROMAX Z-PAK) 250 MG tablet Take 2 tablets (500 mg) on  Day 1,  followed by 1 tablet (250 mg) once daily on Days 2 through 5. 03/02/15   Babs Sciara, MD  HYDROcodone-homatropine (HYCODAN) 5-1.5 MG/5ML syrup Take 5 mLs by mouth every 6 (six) hours as needed for cough. 03/02/15    Babs Sciara, MD  pantoprazole (PROTONIX) 40 MG tablet Take 1 tablet (40 mg total) by mouth daily. 03/10/15   Babs Sciara, MD   BP 122/83 mmHg  Pulse 85  Temp(Src) 98.6 F (37 C) (Oral)  Resp 16  Ht 5\' 9"  (1.753 m)  Wt 158 lb 6 oz (71.838 kg)  BMI 23.38 kg/m2  SpO2 97%   Physical Exam  Constitutional: He is oriented to person, place, and time. He appears well-developed and well-nourished.  HENT:  Head: Normocephalic and atraumatic.  Mouth/Throat: Oropharynx is clear and moist.  Eyes: Conjunctivae and EOM are normal. Pupils are equal, round, and reactive to light.    3cm laceration to right eyebrow; bleeding well controlled; EOMs intact, no visual disturbance  Neck: Normal range of motion.  Cardiovascular: Normal rate, regular rhythm and normal heart sounds.   Pulmonary/Chest: Effort normal and breath sounds normal.  Abdominal: Soft. Bowel sounds are normal.  Musculoskeletal: Normal range of motion.  Neurological: He is alert and oriented to person, place, and time.  Skin: Skin is warm and dry.  Psychiatric: He has a normal mood and affect.  Nursing note and vitals reviewed.   ED Course  Procedures  DIAGNOSTIC STUDIES: Oxygen Saturation is 97% on RA, normal by my interpretation.    COORDINATION OF CARE: 12:48 PM Discussed treatment plan with pt at bedside and pt agreed to plan.  LACERATION REPAIR PROCEDURE NOTE The patient's identification was confirmed and consent was obtained. This procedure was performed by Rosezella [place]. Allyne Gee, PA-C at 1:18 PM. Site: right upper orbital  Sterile procedures observed Anesthetic used (type and amt): lidocaine 1% without epinephrine  Suture type/size: 4-0 Prolene Length: 3 cm  # of Sutures: 4 Technique: simple interrupted  Tetanus UTD  Site anesthetized, irrigated with NS, explored without evidence of foreign body, wound well approximated, site covered with dry, sterile dressing.  Patient tolerated procedure well without  complications. Instructions for care discussed verbally and patient provided with additional written instructions for homecare and f/u.  Labs Review Labs Reviewed - No data to display  Imaging Review No results found.   EKG Interpretation None      MDM   Final diagnoses:  Eyebrow laceration, right, initial encounter   24 y.o. M here with right eyebrow laceration PTA.  No LOC, denies headache or other associated symptoms.  Tetanus UTD.  Laceration repaired as above, patient tolerated well.  Will FU with PCP for suture removal in 1 week.  Discussed home wound care and monitor for infection.  Discussed plan with patient, he/she acknowledged understanding and agreed with plan of care.  Return precautions given for new or worsening symptoms.  I personally performed the services described in this documentation, which was scribed in my presence. The recorded information has been reviewed and is accurate.  Garlon Hatchet, PA-C 05/27/15 1342  Doug Sou, MD 05/27/15 1536

## 2015-05-27 NOTE — Discharge Instructions (Signed)
Follow-up with your primary care physician in 1 week for suture removal. Return to the ED for new or worsening symptoms.

## 2015-06-03 ENCOUNTER — Other Ambulatory Visit: Payer: Self-pay | Admitting: Family Medicine

## 2015-06-05 ENCOUNTER — Encounter (HOSPITAL_COMMUNITY): Payer: Self-pay | Admitting: *Deleted

## 2015-06-05 ENCOUNTER — Emergency Department (HOSPITAL_COMMUNITY)
Admission: EM | Admit: 2015-06-05 | Discharge: 2015-06-05 | Disposition: A | Discharge status: Home / Self Care | Payer: BC Managed Care – PPO | Attending: Emergency Medicine | Admitting: Emergency Medicine

## 2015-06-05 DIAGNOSIS — Z79899 Other long term (current) drug therapy: Secondary | ICD-10-CM | POA: Diagnosis not present

## 2015-06-05 DIAGNOSIS — G8929 Other chronic pain: Secondary | ICD-10-CM | POA: Insufficient documentation

## 2015-06-05 DIAGNOSIS — Z4802 Encounter for removal of sutures: Secondary | ICD-10-CM

## 2015-06-05 DIAGNOSIS — Z792 Long term (current) use of antibiotics: Secondary | ICD-10-CM | POA: Insufficient documentation

## 2015-06-05 DIAGNOSIS — Z880 Allergy status to penicillin: Secondary | ICD-10-CM | POA: Diagnosis not present

## 2015-06-05 NOTE — ED Provider Notes (Signed)
CSN: [id number]     Arrival date & time 06/05/15  1128 History  This chart was scribed for non-physician practitioner Jeralyn Bennett, PA-C working with Elwin Mocha, MD by Lyndel Safe, ED Scribe. This patient was seen in room TR06C/TR06C and the patient's care was started at 11:47 AM.      Chief Complaint  Patient presents with  . Suture / Staple Removal   The history is provided by the patient. No language interpreter was used.   HPI Comments: Luke [name] is a 24 y.o. male who presents to the Emergency Department complaining of suture removal s/p 3 cm laceration in right eyebrow. 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 There were 4 sutures placed simple interrupted style. He reports cleaning the laceration with water immediately after sustaining the wound and then applying hydrogen peroxide 3x day via q-tip during wound care.   Past Medical History  Diagnosis Date  . Stomach pain     Chronic   Past Surgical History  Procedure Laterality Date  . Tonsillectomy    . Wisdom tooth extraction    . Esophagogastroduodenoscopy N/A 01/11/2015    SLF: 1. nausea and epigastric pain most likely due to GERD/ Gastritis 2. Mild Erosive gastritis   Family History  Problem Relation Age of Onset  . Ulcers Maternal Grandfather   . Ulcers Maternal Grandmother   . Esophagitis Mother    History  Substance Use Topics  . Smoking status: Never Smoker   . Smokeless tobacco: Not on file  . Alcohol Use: No    Review of Systems  All other systems reviewed and are negative.  Allergies  Penicillins  Home Medications   Prior to Admission medications   Medication Sig Start Date End Date Taking? Authorizing Provider  azithromycin (ZITHROMAX Z-PAK) 250 MG tablet Take 2 tablets (500 mg) on  Day 1,  followed by 1 tablet (250 mg) once daily on Days 2  through 5. 03/02/15   Babs Sciara, MD  HYDROcodone-homatropine (HYCODAN) 5-1.5 MG/5ML syrup Take 5 mLs by mouth every 6 (six) hours as needed for cough. 03/02/15   Babs Sciara, MD  pantoprazole (PROTONIX) 40 MG tablet Take 1 tablet (40 mg total) by mouth daily. 03/10/15   Babs Sciara, MD  pantoprazole (PROTONIX) 40 MG tablet TAKE 1 TABLET (40 MG TOTAL) BY MOUTH 2 (TWO) TIMES DAILY. 06/03/15   Babs Sciara, MD   BP 115/68 mmHg  Pulse 79  Temp(Src) 98.2 F (36.8 C) (Oral)  Resp 18  SpO2 98%  Physical Exam  Constitutional: He appears well-developed and well-nourished. No distress.  HENT:  Head: Normocephalic.  Healed laceration to the right eyebrow, no signs of surrounding infection  Eyes: Conjunctivae are normal. Right eye exhibits no discharge. Left eye exhibits no discharge. No scleral icterus.  Neck: No JVD present.  Pulmonary/Chest: Effort normal. No respiratory distress.  Neurological: He is alert. Coordination normal.  Skin: Skin is warm. No rash noted. No erythema. No pallor.  Psychiatric: He has a normal mood and affect. His behavior is normal.  Nursing note and vitals reviewed.   ED Course  Procedures  Labs Review Labs Reviewed - No data to display  Imaging Review No results found.   EKG Interpretation None      MDM   Final diagnoses:  Visit for  suture removal   Labs: N/A  Imaging: N/A  Consults: N/A  Therapeutics: N/A  Discharge Meds: N/A  Assessment/Plan: Removing 4 sutures from right eyebrow region. Wound is healed with no tenderness or signs of infection. Advised return precautions. Pt acknowledges and agrees to plan.    I personally performed the services described in this documentation, which was scribed in my presence. The recorded information has been reviewed and is accurate.   Eyvonne Mechanic, PA-C 06/05/15 1429  Elwin Mocha, MD 06/05/15 [phone]

## 2015-06-05 NOTE — ED Notes (Signed)
Declined W/C at D/C and was escorted to lobby by RN. 

## 2015-06-05 NOTE — ED Notes (Signed)
Pt presents today for suture remove .

## 2015-07-07 DIAGNOSIS — M67439 Ganglion, unspecified wrist: Secondary | ICD-10-CM

## 2015-07-07 HISTORY — DX: Ganglion, unspecified wrist: M67.439

## 2015-07-15 ENCOUNTER — Encounter: Payer: Self-pay | Admitting: Family Medicine

## 2015-07-15 ENCOUNTER — Ambulatory Visit (INDEPENDENT_AMBULATORY_CARE_PROVIDER_SITE_OTHER): Payer: BC Managed Care – PPO | Admitting: Family Medicine

## 2015-07-15 VITALS — Ht 69.0 in | Wt 157.8 lb

## 2015-07-15 DIAGNOSIS — M67431 Ganglion, right wrist: Secondary | ICD-10-CM | POA: Diagnosis not present

## 2015-07-15 NOTE — Progress Notes (Signed)
   Subjective:    Patient ID: Luke [name], male    DOB: 27-Dec-1990, 24 y.o.   MRN: [id number]  HPI Patient arrives with c/o knot on top of right wrist for a few months. He states he's now having start to have soreness and stiffness and pain especially when he puts weight on it. He would like to get something done about it. He denies anything triggered it. No family history of this.  Review of Systems Other than the wrist swelling in wrist pain he is not having any other symptoms no numbness or tingling no fever no known injury    Objective:   Physical Exam Patient has a significant ganglion cysts in the right wrist limits of motion Hollice Espy some intermittent pain and discomfort The forearm is normal hand is normal.       Assessment & Plan:  Ganglion cyst-I believe this patient would benefit from having referral to orthopedics and have probable aspiration of possible surgery on.

## 2015-07-28 ENCOUNTER — Other Ambulatory Visit: Payer: Self-pay | Admitting: Orthopedic Surgery

## 2015-08-02 ENCOUNTER — Encounter: Payer: Self-pay | Admitting: Family Medicine

## 2015-08-02 ENCOUNTER — Encounter (HOSPITAL_BASED_OUTPATIENT_CLINIC_OR_DEPARTMENT_OTHER): Payer: Self-pay | Admitting: *Deleted

## 2015-08-02 ENCOUNTER — Ambulatory Visit (INDEPENDENT_AMBULATORY_CARE_PROVIDER_SITE_OTHER): Payer: BC Managed Care – PPO | Admitting: Family Medicine

## 2015-08-02 VITALS — BP 110/76 | Temp 98.7°F | Ht 69.0 in | Wt 155.2 lb

## 2015-08-02 DIAGNOSIS — A084 Viral intestinal infection, unspecified: Secondary | ICD-10-CM | POA: Diagnosis not present

## 2015-08-02 MED ORDER — PROMETHAZINE HCL 25 MG PO TABS: 25.0000 mg | ORAL_TABLET | Freq: Three times a day (TID) | ORAL | Status: DC | PRN

## 2015-08-02 MED ORDER — ONDANSETRON HCL 8 MG PO TABS: 8.0000 mg | ORAL_TABLET | Freq: Three times a day (TID) | ORAL | Status: DC | PRN

## 2015-08-02 NOTE — Progress Notes (Signed)
   Subjective:    Patient ID: Luke [name], male    DOB: [birth year], 24 y.o.   MRN: [id number]  Abdominal Pain This is a new problem. The current episode started in the past 7 days. The problem has been unchanged. The quality of the pain is burning. Associated symptoms include diarrhea, nausea and vomiting. Nothing aggravates the pain. Relieved by: Phenergan. Treatments tried: Anti nausea medication. The treatment provided mild relief.  had recent evaluation for EGD. Patient denies hemoptysis or hematochezia or hematemesis Patient states no other concerns this visit.   Review of Systems  Gastrointestinal: Positive for nausea, vomiting, abdominal pain and diarrhea.   No fever chills no bloody stools.    Objective:   Physical Exam   Lungs clear heart regular pulse normal abdomen soft no guarding rebound or tenderness.     Assessment & Plan:  Probable viral syndrome should gradually get better supportive care is discussed including nausea medicine continue PPI for GERD if ongoing troubles may need lab work call us if any problems  Should be able to have surgery for ganglion cyst later this coming week

## 2015-08-04 ENCOUNTER — Encounter: Payer: Self-pay | Admitting: Family Medicine

## 2015-08-05 ENCOUNTER — Encounter (HOSPITAL_BASED_OUTPATIENT_CLINIC_OR_DEPARTMENT_OTHER): Payer: Self-pay | Admitting: *Deleted

## 2015-08-05 ENCOUNTER — Ambulatory Visit (HOSPITAL_BASED_OUTPATIENT_CLINIC_OR_DEPARTMENT_OTHER): Payer: BC Managed Care – PPO | Admitting: Anesthesiology

## 2015-08-05 ENCOUNTER — Ambulatory Visit (HOSPITAL_BASED_OUTPATIENT_CLINIC_OR_DEPARTMENT_OTHER)
Admission: RE | Admit: 2015-08-05 | Discharge: 2015-08-05 | Disposition: A | Discharge status: Home / Self Care | Payer: BC Managed Care – PPO | Source: Ambulatory Visit | Attending: Orthopedic Surgery | Admitting: Orthopedic Surgery

## 2015-08-05 ENCOUNTER — Encounter (HOSPITAL_BASED_OUTPATIENT_CLINIC_OR_DEPARTMENT_OTHER)
Admission: RE | Disposition: A | Discharge status: Home / Self Care | Payer: Self-pay | Source: Ambulatory Visit | Attending: Orthopedic Surgery

## 2015-08-05 DIAGNOSIS — Z79899 Other long term (current) drug therapy: Secondary | ICD-10-CM | POA: Insufficient documentation

## 2015-08-05 DIAGNOSIS — M67431 Ganglion, right wrist: Secondary | ICD-10-CM | POA: Diagnosis present

## 2015-08-05 DIAGNOSIS — K219 Gastro-esophageal reflux disease without esophagitis: Secondary | ICD-10-CM | POA: Insufficient documentation

## 2015-08-05 HISTORY — DX: Ganglion, unspecified wrist: M67.439

## 2015-08-05 HISTORY — DX: Family history of other specified conditions: Z84.89

## 2015-08-05 HISTORY — PX: MASS EXCISION: SHX2000

## 2015-08-05 HISTORY — DX: Personal history of Methicillin resistant Staphylococcus aureus infection: Z86.14

## 2015-08-05 HISTORY — DX: Gastro-esophageal reflux disease without esophagitis: K21.9

## 2015-08-05 SURGERY — EXCISION MASS
Anesthesia: General | Site: Wrist | Laterality: Right

## 2015-08-05 MED ORDER — HYDROCODONE-ACETAMINOPHEN 5-325 MG PO TABS: ORAL_TABLET | ORAL | Status: DC

## 2015-08-05 MED ORDER — OXYCODONE HCL 5 MG/5ML PO SOLN: 5.0000 mg | Freq: Once | ORAL | Status: DC | PRN

## 2015-08-05 MED ORDER — KETOROLAC TROMETHAMINE 30 MG/ML IJ SOLN
INTRAMUSCULAR | Status: AC
  Filled 2015-08-05: qty 1

## 2015-08-05 MED ORDER — FENTANYL CITRATE (PF) 100 MCG/2ML IJ SOLN
50.0000 ug | INTRAMUSCULAR | Status: DC | PRN
  Administered 2015-08-05: 100 ug via INTRAVENOUS

## 2015-08-05 MED ORDER — FENTANYL CITRATE (PF) 100 MCG/2ML IJ SOLN
INTRAMUSCULAR | Status: AC
  Filled 2015-08-05: qty 4

## 2015-08-05 MED ORDER — ONDANSETRON HCL 4 MG/2ML IJ SOLN: 4.0000 mg | Freq: Four times a day (QID) | INTRAMUSCULAR | Status: DC | PRN

## 2015-08-05 MED ORDER — MIDAZOLAM HCL 2 MG/2ML IJ SOLN
INTRAMUSCULAR | Status: AC
  Filled 2015-08-05: qty 4

## 2015-08-05 MED ORDER — ONDANSETRON HCL 4 MG/2ML IJ SOLN
INTRAMUSCULAR | Status: DC | PRN
  Administered 2015-08-05: 4 mg via INTRAVENOUS

## 2015-08-05 MED ORDER — MIDAZOLAM HCL 2 MG/2ML IJ SOLN
0.5000 mg | INTRAMUSCULAR | Status: DC | PRN
  Administered 2015-08-05: 2 mg via INTRAVENOUS

## 2015-08-05 MED ORDER — CHLORHEXIDINE GLUCONATE 4 % EX LIQD: 60.0000 mL | Freq: Once | CUTANEOUS | Status: DC

## 2015-08-05 MED ORDER — GLYCOPYRROLATE 0.2 MG/ML IJ SOLN: 0.2000 mg | Freq: Once | INTRAMUSCULAR | Status: DC | PRN

## 2015-08-05 MED ORDER — ONDANSETRON HCL 4 MG/2ML IJ SOLN
INTRAMUSCULAR | Status: AC
  Filled 2015-08-05: qty 2

## 2015-08-05 MED ORDER — LIDOCAINE HCL (CARDIAC) 20 MG/ML IV SOLN
INTRAVENOUS | Status: DC | PRN
  Administered 2015-08-05: 50 mg via INTRAVENOUS

## 2015-08-05 MED ORDER — OXYCODONE HCL 5 MG PO TABS: 5.0000 mg | ORAL_TABLET | Freq: Once | ORAL | Status: DC | PRN

## 2015-08-05 MED ORDER — FENTANYL CITRATE (PF) 100 MCG/2ML IJ SOLN
INTRAMUSCULAR | Status: AC
  Filled 2015-08-05: qty 2

## 2015-08-05 MED ORDER — BUPIVACAINE HCL (PF) 0.25 % IJ SOLN
INTRAMUSCULAR | Status: DC | PRN
  Administered 2015-08-05: 5 mL

## 2015-08-05 MED ORDER — DEXAMETHASONE SODIUM PHOSPHATE 4 MG/ML IJ SOLN
INTRAMUSCULAR | Status: DC | PRN
  Administered 2015-08-05: 10 mg via INTRAVENOUS

## 2015-08-05 MED ORDER — VANCOMYCIN HCL IN DEXTROSE 1-5 GM/200ML-% IV SOLN
1000.0000 mg | INTRAVENOUS | Status: AC
  Administered 2015-08-05: 1000 mg via INTRAVENOUS

## 2015-08-05 MED ORDER — LIDOCAINE HCL (CARDIAC) 20 MG/ML IV SOLN
INTRAVENOUS | Status: AC
  Filled 2015-08-05: qty 5

## 2015-08-05 MED ORDER — KETOROLAC TROMETHAMINE 30 MG/ML IJ SOLN
INTRAMUSCULAR | Status: DC | PRN
  Administered 2015-08-05: 30 mg via INTRAVENOUS

## 2015-08-05 MED ORDER — LACTATED RINGERS IV SOLN
INTRAVENOUS | Status: DC
  Administered 2015-08-05: 13:00:00 via INTRAVENOUS

## 2015-08-05 MED ORDER — VANCOMYCIN HCL IN DEXTROSE 1-5 GM/200ML-% IV SOLN
INTRAVENOUS | Status: AC
  Filled 2015-08-05: qty 200

## 2015-08-05 MED ORDER — FENTANYL CITRATE (PF) 100 MCG/2ML IJ SOLN
25.0000 ug | INTRAMUSCULAR | Status: DC | PRN
  Administered 2015-08-05: 50 ug via INTRAVENOUS

## 2015-08-05 MED ORDER — PROPOFOL 10 MG/ML IV BOLUS
INTRAVENOUS | Status: DC | PRN
  Administered 2015-08-05: 200 mg via INTRAVENOUS

## 2015-08-05 MED ORDER — DEXAMETHASONE SODIUM PHOSPHATE 10 MG/ML IJ SOLN
INTRAMUSCULAR | Status: AC
  Filled 2015-08-05: qty 1

## 2015-08-05 SURGICAL SUPPLY — 60 items
APL SKNCLS STERI-STRIP NONHPOA (GAUZE/BANDAGES/DRESSINGS) ×1
BANDAGE COBAN STERILE 2 (GAUZE/BANDAGES/DRESSINGS) IMPLANT
BANDAGE ELASTIC 3 VELCRO ST LF (GAUZE/BANDAGES/DRESSINGS) IMPLANT
BENZOIN TINCTURE PRP APPL 2/3 (GAUZE/BANDAGES/DRESSINGS) ×2 IMPLANT
BLADE MINI RND TIP GREEN BEAV (BLADE) IMPLANT
BLADE SURG 15 STRL LF DISP TIS (BLADE) ×2 IMPLANT
BLADE SURG 15 STRL SS (BLADE) ×6
BNDG CMPR 9X4 STRL LF SNTH (GAUZE/BANDAGES/DRESSINGS) ×1
BNDG CMPR MD 5X2 ELC HKLP STRL (GAUZE/BANDAGES/DRESSINGS)
BNDG COHESIVE 1X5 TAN STRL LF (GAUZE/BANDAGES/DRESSINGS) IMPLANT
BNDG CONFORM 2 STRL LF (GAUZE/BANDAGES/DRESSINGS) IMPLANT
BNDG ELASTIC 2 VLCR STRL LF (GAUZE/BANDAGES/DRESSINGS) IMPLANT
BNDG ESMARK 4X9 LF (GAUZE/BANDAGES/DRESSINGS) ×2 IMPLANT
BNDG GAUZE 1X2.1 STRL (MISCELLANEOUS) IMPLANT
BNDG GAUZE ELAST 4 BULKY (GAUZE/BANDAGES/DRESSINGS) ×2 IMPLANT
BNDG PLASTER X FAST 3X3 WHT LF (CAST SUPPLIES) IMPLANT
BNDG PLSTR 9X3 FST ST WHT (CAST SUPPLIES)
CHLORAPREP W/TINT 26ML (MISCELLANEOUS) ×3 IMPLANT
CLOSURE WOUND 1/2 X4 (GAUZE/BANDAGES/DRESSINGS) ×1
CORDS BIPOLAR (ELECTRODE) ×3 IMPLANT
COVER BACK TABLE 60X90IN (DRAPES) ×3 IMPLANT
COVER MAYO STAND STRL (DRAPES) ×3 IMPLANT
CUFF TOURNIQUET SINGLE 18IN (TOURNIQUET CUFF) ×3 IMPLANT
DRAPE EXTREMITY T 121X128X90 (DRAPE) ×3 IMPLANT
DRAPE SURG 17X23 STRL (DRAPES) ×3 IMPLANT
GAUZE SPONGE 4X4 12PLY STRL (GAUZE/BANDAGES/DRESSINGS) ×3 IMPLANT
GAUZE XEROFORM 1X8 LF (GAUZE/BANDAGES/DRESSINGS) ×1 IMPLANT
GLOVE BIO SURGEON STRL SZ 6.5 (GLOVE) ×1 IMPLANT
GLOVE BIO SURGEON STRL SZ7.5 (GLOVE) ×3 IMPLANT
GLOVE BIO SURGEONS STRL SZ 6.5 (GLOVE) ×1
GLOVE BIOGEL PI IND STRL 7.0 (GLOVE) IMPLANT
GLOVE BIOGEL PI IND STRL 8 (GLOVE) ×1 IMPLANT
GLOVE BIOGEL PI INDICATOR 7.0 (GLOVE) ×4
GLOVE BIOGEL PI INDICATOR 8 (GLOVE) ×2
GOWN STRL REUS W/ TWL LRG LVL3 (GOWN DISPOSABLE) ×1 IMPLANT
GOWN STRL REUS W/TWL LRG LVL3 (GOWN DISPOSABLE) ×3
GOWN STRL REUS W/TWL XL LVL3 (GOWN DISPOSABLE) ×3 IMPLANT
NDL HYPO 25X1 1.5 SAFETY (NEEDLE) ×1 IMPLANT
NEEDLE HYPO 25X1 1.5 SAFETY (NEEDLE) ×3 IMPLANT
NS IRRIG 1000ML POUR BTL (IV SOLUTION) ×3 IMPLANT
PACK BASIN DAY SURGERY FS (CUSTOM PROCEDURE TRAY) ×3 IMPLANT
PAD CAST 3X4 CTTN HI CHSV (CAST SUPPLIES) IMPLANT
PAD CAST 4YDX4 CTTN HI CHSV (CAST SUPPLIES) IMPLANT
PADDING CAST ABS 4INX4YD NS (CAST SUPPLIES) ×2
PADDING CAST ABS COTTON 4X4 ST (CAST SUPPLIES) ×1 IMPLANT
PADDING CAST COTTON 3X4 STRL (CAST SUPPLIES)
PADDING CAST COTTON 4X4 STRL (CAST SUPPLIES) ×3
STOCKINETTE 4X48 STRL (DRAPES) ×3 IMPLANT
STRIP CLOSURE SKIN 1/2X4 (GAUZE/BANDAGES/DRESSINGS) ×1 IMPLANT
SUT ETHILON 3 0 PS 1 (SUTURE) IMPLANT
SUT ETHILON 4 0 PS 2 18 (SUTURE) ×3 IMPLANT
SUT ETHILON 5 0 P 3 18 (SUTURE)
SUT MNCRL 6-0 UNDY P1 1X18 (SUTURE) IMPLANT
SUT MONOCRYL 6-0 P1 1X18 (SUTURE)
SUT NYLON ETHILON 5-0 P-3 1X18 (SUTURE) IMPLANT
SUT VIC AB 4-0 P2 18 (SUTURE) ×2 IMPLANT
SYR BULB 3OZ (MISCELLANEOUS) ×3 IMPLANT
SYR CONTROL 10ML LL (SYRINGE) ×3 IMPLANT
TOWEL OR 17X24 6PK STRL BLUE (TOWEL DISPOSABLE) ×6 IMPLANT
UNDERPAD 30X30 (UNDERPADS AND DIAPERS) ×3 IMPLANT

## 2015-08-05 NOTE — Anesthesia Preprocedure Evaluation (Signed)
Anesthesia Evaluation  Patient identified by MRN, date of birth, ID band Patient awake    Reviewed: Allergy & Precautions, NPO status , Patient's Chart, lab work & pertinent test results  Airway Mallampati: I   Neck ROM: full    Dental   Pulmonary neg pulmonary ROS,    breath sounds clear to auscultation       Cardiovascular negative cardio ROS   Rhythm:regular Rate:Normal     Neuro/Psych    GI/Hepatic GERD  ,  Endo/Other    Renal/GU      Musculoskeletal   Abdominal   Peds  Hematology   Anesthesia Other Findings   Reproductive/Obstetrics                             Anesthesia Physical Anesthesia Plan  ASA: II  Anesthesia Plan: General   Post-op Pain Management:    Induction: Intravenous  Airway Management Planned: LMA  Additional Equipment:   Intra-op Plan:   Post-operative Plan:   Informed Consent: I have reviewed the patients History and Physical, chart, labs and discussed the procedure including the risks, benefits and alternatives for the proposed anesthesia with the patient or authorized representative who has indicated his/her understanding and acceptance.     Plan Discussed with: CRNA, Anesthesiologist and Surgeon  Anesthesia Plan Comments:         Anesthesia Quick Evaluation

## 2015-08-05 NOTE — Brief Op Note (Signed)
08/05/2015  3:18 PM  PATIENT:  Dorian A Gloeckner  24 y.o. male  PRE-OPERATIVE DIAGNOSIS:  right wrist dorsal ganglion  M62.431  POST-OPERATIVE DIAGNOSIS:  right wrist dorsal ganglion  M62.431  PROCEDURE:  Procedure(s): RIGHT WRIST EXCISION MASS (Right)  SURGEON:  Surgeon(s) and Role:    * Betha Loa, MD - Primary  PHYSICIAN ASSISTANT:   ASSISTANTS: none   ANESTHESIA:   general  EBL:  Total I/O In: 600 [I.V.:600] Out: -   BLOOD ADMINISTERED:none  DRAINS: none   LOCAL MEDICATIONS USED:  MARCAINE     SPECIMEN:  Source of Specimen:  right wrist  DISPOSITION OF SPECIMEN:  PATHOLOGY  COUNTS:  YES  TOURNIQUET:   Total Tourniquet Time Documented: Upper Arm (Right) - 21 minutes Total: Upper Arm (Right) - 21 minutes   DICTATION: .Other Dictation: Dictation Number [id number]  PLAN OF CARE: Discharge to home after PACU  PATIENT DISPOSITION:  PACU - hemodynamically stable.

## 2015-08-05 NOTE — Transfer of Care (Signed)
Immediate Anesthesia Transfer of Care Note  Patient: Luke [name]  Procedure(s) Performed: Procedure(s): RIGHT WRIST EXCISION MASS (Right)  Patient Location: PACU  Anesthesia Type:General  Level of Consciousness: awake and sedated  Airway & Oxygen Therapy: Patient Spontanous Breathing and Patient connected to face mask oxygen  Post-op Assessment: Report given to RN and Post -op Vital signs reviewed and stable  Post vital signs: Reviewed and stable  Last Vitals:  Filed Vitals:   08/05/15 1240  BP: 116/67  Pulse: 67  Temp: 36.9 C  Resp: 20    Complications: No apparent anesthesia complications

## 2015-08-05 NOTE — Op Note (Signed)
[id number] 

## 2015-08-05 NOTE — Anesthesia Procedure Notes (Signed)
Procedure Name: LMA Insertion Performed by: PEARSON, DONNA W Pre-anesthesia Checklist: Patient identified, Emergency Drugs available, Suction available and Patient being monitored Patient Re-evaluated:Patient Re-evaluated prior to inductionOxygen Delivery Method: Circle System Utilized Preoxygenation: Pre-oxygenation with 100% oxygen Intubation Type: IV induction Ventilation: Mask ventilation without difficulty LMA: LMA inserted LMA Size: 4.0 Number of attempts: 1 Placement Confirmation: positive ETCO2 Tube secured with: Tape Dental Injury: Teeth and Oropharynx as per pre-operative assessment      

## 2015-08-05 NOTE — Anesthesia Postprocedure Evaluation (Signed)
  Anesthesia Post-op Note  Patient: Luke [name]  Procedure(s) Performed: Procedure(s): RIGHT WRIST EXCISION MASS (Right)  Patient Location: PACU  Anesthesia Type:General  Level of Consciousness: awake, alert , oriented and patient cooperative  Airway and Oxygen Therapy: Patient Spontanous Breathing  Post-op Pain: none  Post-op Assessment: Post-op Vital signs reviewed, Patient's Cardiovascular Status Stable, Respiratory Function Stable, Patent Airway, No signs of Nausea or vomiting and Pain level controlled              Post-op Vital Signs: Reviewed and stable  Last Vitals:  Filed Vitals:   08/05/15 1600  BP: 115/72  Pulse: 68  Temp:   Resp: 10    Complications: No apparent anesthesia complications

## 2015-08-05 NOTE — Discharge Instructions (Addendum)

## 2015-08-05 NOTE — H&P (Signed)
  Luke [name] is an 24 y.o. male.   Chief Complaint: right wrist ganglion HPI: 24 yo rhd male with cyst on right wrist ~ 2 months.  He wishes to have it removed.  It is bothersome to him.  Painful with dorsiflexion.  Past Medical History  Diagnosis Date  . GERD (gastroesophageal reflux disease)   . Family history of adverse reaction to anesthesia     mother states is hard for her to wake up post-op  . Ganglion cyst of wrist 07/2015    right  . History of MRSA infection as a teenager    chin    Past Surgical History  Procedure Laterality Date  . Wisdom tooth extraction    . Esophagogastroduodenoscopy N/A 01/11/2015    SLF: 1. nausea and epigastric pain most likely due to GERD/ Gastritis 2. Mild Erosive gastritis  . Tonsillectomy and adenoidectomy      Family History  Problem Relation Age of Onset  . Ulcers Maternal Grandfather   . Ulcers Maternal Grandmother   . Esophagitis Mother    Social History:  reports that he has never smoked. He has never used smokeless tobacco. He reports that he drinks alcohol. He reports that he does not use illicit drugs.  Allergies:  Allergies  Allergen Reactions  . Penicillins Hives    Medications Prior to Admission  Medication Sig Dispense Refill  . ondansetron (ZOFRAN) 8 MG tablet Take 1 tablet (8 mg total) by mouth every 8 (eight) hours as needed for nausea. 20 tablet 1  . pantoprazole (PROTONIX) 40 MG tablet Take 40 mg by mouth 2 (two) times daily.    . promethazine (PHENERGAN) 25 MG tablet Take 1 tablet (25 mg total) by mouth every 8 (eight) hours as needed for nausea or vomiting. 20 tablet 1    No results found for this or any previous visit (from the past 48 hour(s)).  No results found.   A comprehensive review of systems was negative except for: Musculoskeletal: positive for prior fracture  Blood pressure 116/67, pulse 67, temperature 98.4 F (36.9 C), temperature source Oral, resp. rate 20, height  (1.702 m), weight 69.4  kg (153 lb), SpO2 100 %.  General appearance: alert, cooperative and appears stated age Head: Normocephalic, without obvious abnormality, atraumatic Neck: supple, symmetrical, trachea midline Resp: clear to auscultation bilaterally Cardio: regular rate and rhythm GI: non tender Extremities: intact sensation and capillary refill all digits.  +epl/fpl/io.  no wounds. Pulses: 2+ and symmetric Skin: Skin color, texture, turgor normal. No rashes or lesions Neurologic: Grossly normal Incision/Wound:  none  Assessment/Plan Right wrist dorsal ganglion cyst.  Non operative and operative treatment options were discussed with the patient and patient wishes to proceed with operative treatment. Risks, benefits, and alternatives of surgery were discussed and the patient agrees with the plan of care.   KUZMA,KEVIN R 08/05/2015, 2:33 PM

## 2015-08-06 NOTE — Op Note (Signed)
NAMETOMIE, ELKO               ACCOUNT NO.:  [id number]  MEDICAL RECORD NO.:  [id number]  LOCATION:                                 FACILITY:  PHYSICIAN:  Betha Loa, MD        DATE OF BIRTH:  05-15-1991  DATE OF PROCEDURE:  08/05/2015 DATE OF DISCHARGE:                              OPERATIVE REPORT   PREOPERATIVE DIAGNOSIS:  Right wrist dorsal ganglion cyst.  POSTOPERATIVE DIAGNOSIS:  Right wrist dorsal ganglion cyst.  PROCEDURE:  Excision of right wrist dorsal ganglion cyst.  SURGEON:  Betha Loa, MD  ASSISTANT:  None.  ANESTHESIA:  General.  IV FLUIDS:  Per anesthesia flow sheet.  ESTIMATED BLOOD LOSS:  Minimal.  COMPLICATIONS:  None.  SPECIMENS:  None.  TIME OF TOURNIQUET:  21 minutes.  DISPOSITION:  Stable to PACU.  INDICATIONS:  Luke [name] is a 24 year old male, who has had a cyst on the dorsum of his right wrist for approximately 2 months.  It is bothersome to him.  It is painful when he dorsiflexed.  He wished to have it removed.  Risks, benefits, and alternatives of surgery were discussed including risk of blood loss; infection; damage to nerves, vessels, tendons, ligaments, bone; failure of surgery; need for additional surgery; complications with wound healing, continued pain, and recurrence of mass.  He voiced understanding of these risks and elected to proceed.  OPERATIVE COURSE:  After being identified preoperatively by myself, the patient and I agreed upon procedure and site of procedure.  Surgical site was marked.  The risks, benefits, and alternatives of surgery were reviewed and wished to proceed.  Surgical consent had been signed.  He was given 1 g of IV vancomycin as preoperative antibiotic prophylaxis due to penicillin allergy.  He was transferred to the operating room and placed on the operating table in supine position with the right upper extremity on arm board.  General anesthesia was induced by Anesthesiology.  Right upper extremity  was prepped and draped in normal sterile orthopedic fashion.  Surgical pause was performed between surgeons, anesthesia, and operating room staff, and all were in agreement as to the patient, procedure, and site of proceed.  Tourniquet at the proximal aspect of the extremity inflated to 250 mmHg after exsanguination of the limb with Esmarch bandage.  An incision was made on the dorsum of the wrist and carried into subcutaneous tissues by spreading technique.  The cyst was easily identified.  It was cleared of soft tissue attachments.  The stalk was traced down to the carpus.  The cyst was removed and sent to Pathology for examination.  The rent in the capsule was repaired with a 4-0 Vicryl suture in a figure-of-eight fashion.  The wound was copiously irrigated with sterile saline.  The 4- 0 Vicryl suture was used in inverted interrupted fashion in the subcutaneous tissues.  The skin was closed with 5-0 Monocryl in a running subcuticular fashion.  This was augmented with Steri-Strips. The wound was injected with 5 mL of 0.25% plain Marcaine to aid in postoperative analgesia.  It was then dressed with sterile Xeroform, 4x4s, and wrapped with a Kerlix bandage.  A volar splint was  placed and wrapped with Kerlix and Ace bandage.  Tourniquet was deflated at 21 minutes.  Fingertips were pink with brisk capillary refill after deflation of tourniquet.  Operative drapes were broken down.  The patient was awoken from anesthesia safely.  He was transferred back to stretcher and taken to PACU in stable condition.  I will see him back in the office in 1 week for postoperative followup.  Norco 5/325, 1-2 p.o. q.6 hours p.r.n. pain, dispensed #30.     Betha Loa, MD     KK/MEDQ  D:  08/05/2015  T:  08/06/2015  Job:  [id number]

## 2015-08-08 ENCOUNTER — Encounter (HOSPITAL_BASED_OUTPATIENT_CLINIC_OR_DEPARTMENT_OTHER): Payer: Self-pay | Admitting: Orthopedic Surgery

## 2016-01-06 ENCOUNTER — Telehealth: Payer: Self-pay

## 2016-01-06 MED ORDER — PROMETHAZINE HCL 25 MG PO TABS: ORAL_TABLET | ORAL | Status: DC

## 2016-01-06 NOTE — Telephone Encounter (Signed)
Patient has nausea and vomiting. Phenergan sent to pharmacy per protocol from Dr. Lorin PicketScott.

## 2016-01-08 NOTE — Telephone Encounter (Signed)
Mom to have pt follow up if ongoing sx ( N V dizziness/vertigo sx for 1 day) has meclizine at home

## 2016-12-31 ENCOUNTER — Encounter: Payer: Self-pay | Admitting: Family Medicine

## 2016-12-31 ENCOUNTER — Ambulatory Visit (INDEPENDENT_AMBULATORY_CARE_PROVIDER_SITE_OTHER): Payer: Self-pay | Admitting: Family Medicine

## 2016-12-31 VITALS — BP 114/70 | Temp 97.4°F | Ht 69.0 in | Wt 154.2 lb

## 2016-12-31 DIAGNOSIS — K2970 Gastritis, unspecified, without bleeding: Secondary | ICD-10-CM

## 2016-12-31 DIAGNOSIS — R369 Urethral discharge, unspecified: Secondary | ICD-10-CM

## 2016-12-31 DIAGNOSIS — K2990 Gastroduodenitis, unspecified, without bleeding: Secondary | ICD-10-CM

## 2016-12-31 MED ORDER — PANTOPRAZOLE SODIUM 40 MG PO TBEC: 40.0000 mg | DELAYED_RELEASE_TABLET | Freq: Every day | ORAL | 1 refills | Status: DC

## 2016-12-31 NOTE — Progress Notes (Signed)
   Subjective:    Patient ID: Linton Hameshaun A Hollyfield, male    DOB: 02/23/1991, 26 y.o.   MRN: [id number]  Abdominal Pain  This is a new problem. The current episode started in the past 7 days. The pain is located in the epigastric region and generalized abdominal region. The quality of the pain is a sensation of fullness, aching and cramping. Associated symptoms include nausea.   Patient states no other concerns this visit.   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       Objective:   Physical Exam Alert vital stable hydration good lungs clear heart rare rhythm distinct epigastric tenderness. I'll exam within normal limits urethral sample obtained       Assessment & Plan:  Impression flare of gastritis with history of this in the past. Acute reason for recurrence not apparent #2 girlfriend has "UTI" and patient concerned about transient urethral discharge. He notes she's had this in the past and it has not been a problem plan GC chlamydia screen. Initiate protonic 40 mg daily rationale discussed

## 2017-01-01 LAB — GC/CHLAMYDIA PROBE AMP
Chlamydia trachomatis, NAA: NEGATIVE
Neisseria gonorrhoeae by PCR: NEGATIVE

## 2017-01-05 ENCOUNTER — Telehealth: Payer: Self-pay | Admitting: Family Medicine

## 2017-01-05 MED ORDER — DOXYCYCLINE HYCLATE 100 MG PO TABS: 100.0000 mg | ORAL_TABLET | Freq: Two times a day (BID) | ORAL | 0 refills | Status: DC

## 2017-01-05 NOTE — Telephone Encounter (Signed)
I spoke with the patient he was having some problems with dysuria earlier this morning as well as urethral discharge his GC chlamydia probe from earlier in the week was negative. Certainly this could be nongonococcal non-chlamydia urethritis patient is sexually active his girlfriend is being treated. I recommend doxycycline 100 mg twice a day for 10 days the patient is aware we will send the prescription in to his [facility] pharmacy today and he is aware to follow-up with us if ongoing troubles-patient aware to take medication with the snack and a tall glass of water twice daily

## 2020-04-30 ENCOUNTER — Emergency Department (HOSPITAL_COMMUNITY): Payer: BC Managed Care – PPO

## 2020-04-30 ENCOUNTER — Encounter (HOSPITAL_COMMUNITY): Payer: Self-pay | Admitting: Emergency Medicine

## 2020-04-30 ENCOUNTER — Emergency Department (HOSPITAL_COMMUNITY)
Admission: EM | Admit: 2020-04-30 | Discharge: 2020-04-30 | Disposition: A | Discharge status: Home / Self Care | Payer: BC Managed Care – PPO | Attending: Emergency Medicine | Admitting: Emergency Medicine

## 2020-04-30 ENCOUNTER — Other Ambulatory Visit: Payer: Self-pay

## 2020-04-30 DIAGNOSIS — Y999 Unspecified external cause status: Secondary | ICD-10-CM | POA: Diagnosis not present

## 2020-04-30 DIAGNOSIS — Y9389 Activity, other specified: Secondary | ICD-10-CM | POA: Diagnosis not present

## 2020-04-30 DIAGNOSIS — S27322A Contusion of lung, bilateral, initial encounter: Secondary | ICD-10-CM

## 2020-04-30 DIAGNOSIS — S299XXA Unspecified injury of thorax, initial encounter: Secondary | ICD-10-CM | POA: Diagnosis present

## 2020-04-30 DIAGNOSIS — Z880 Allergy status to penicillin: Secondary | ICD-10-CM | POA: Diagnosis not present

## 2020-04-30 DIAGNOSIS — K30 Functional dyspepsia: Secondary | ICD-10-CM | POA: Diagnosis not present

## 2020-04-30 DIAGNOSIS — Y9289 Other specified places as the place of occurrence of the external cause: Secondary | ICD-10-CM | POA: Diagnosis not present

## 2020-04-30 DIAGNOSIS — S2232XA Fracture of one rib, left side, initial encounter for closed fracture: Secondary | ICD-10-CM | POA: Diagnosis not present

## 2020-04-30 LAB — CBC
HCT: 43.8 % (ref 39.0–52.0)
Hemoglobin: 14.4 g/dL (ref 13.0–17.0)
MCH: 31.6 pg (ref 26.0–34.0)
MCHC: 32.9 g/dL (ref 30.0–36.0)
MCV: 96.1 fL (ref 80.0–100.0)
Platelets: 201 10*3/uL (ref 150–400)
RBC: 4.56 MIL/uL (ref 4.22–5.81)
RDW: 13.2 % (ref 11.5–15.5)
WBC: 13.4 10*3/uL — ABNORMAL HIGH (ref 4.0–10.5)
nRBC: 0 % (ref 0.0–0.2)

## 2020-04-30 LAB — COMPREHENSIVE METABOLIC PANEL
ALT: 52 U/L — ABNORMAL HIGH (ref 0–44)
AST: 105 U/L — ABNORMAL HIGH (ref 15–41)
Albumin: 5.1 g/dL — ABNORMAL HIGH (ref 3.5–5.0)
Alkaline Phosphatase: 52 U/L (ref 38–126)
Anion gap: 12 (ref 5–15)
BUN: 20 mg/dL (ref 6–20)
CO2: 24 mmol/L (ref 22–32)
Calcium: 9.4 mg/dL (ref 8.9–10.3)
Chloride: 104 mmol/L (ref 98–111)
Creatinine, Ser: 1 mg/dL (ref 0.61–1.24)
GFR calc Af Amer: >60 mL/min (ref >60)
GFR calc non Af Amer: >60 mL/min (ref >60)
Glucose, Bld: 98 mg/dL (ref 70–99)
Potassium: 4.2 mmol/L (ref 3.5–5.1)
Sodium: 140 mmol/L (ref 135–145)
Total Bilirubin: 0.9 mg/dL (ref 0.3–1.2)
Total Protein: 7.6 g/dL (ref 6.5–8.1)

## 2020-04-30 MED ORDER — IOHEXOL 350 MG/ML SOLN
100.0000 mL | Freq: Once | INTRAVENOUS | Status: AC | PRN
  Administered 2020-04-30: 100 mL via INTRAVENOUS

## 2020-04-30 MED ORDER — IOHEXOL 300 MG/ML  SOLN
75.0000 mL | Freq: Once | INTRAMUSCULAR | Status: AC | PRN
  Administered 2020-04-30: 75 mL via INTRAVENOUS

## 2020-04-30 MED ORDER — ACETAMINOPHEN 325 MG PO TABS
650.0000 mg | ORAL_TABLET | Freq: Once | ORAL | Status: AC
  Administered 2020-04-30: 650 mg via ORAL
  Filled 2020-04-30: qty 2
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
## 2020-04-30 NOTE — ED Notes (Signed)
Patient transported to CT 

## 2020-04-30 NOTE — ED Notes (Signed)
Pt placed on telemetry per EDP's VO
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
## 2020-04-30 NOTE — Discharge Instructions (Signed)
Please treat your pain with Tylenol/ibuprofen. Please use the incentive spirometer once every 2-3 hours. You understand the risks of leaving without observation given your pulmonary contusions. If at any point you have any worsening or concerning symptoms, return to the ER. Please follow-up with your primary care doctor within the week.
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
## 2020-05-01 ENCOUNTER — Telehealth: Payer: Self-pay | Admitting: Family Medicine
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
## 2020-05-02 NOTE — Telephone Encounter (Signed)
Lmtc

## 2020-05-02 NOTE — Telephone Encounter (Signed)
Patient states he is doing okay just very sore. He will call back id he feels he needs an appointment.
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
## 2022-10-09 ENCOUNTER — Ambulatory Visit
Admission: EM | Admit: 2022-10-09 | Discharge: 2022-10-09 | Disposition: A | Discharge status: Home / Self Care | Payer: BC Managed Care – PPO | Attending: Nurse Practitioner | Admitting: Nurse Practitioner

## 2022-10-09 ENCOUNTER — Other Ambulatory Visit: Payer: Self-pay

## 2022-10-09 ENCOUNTER — Encounter: Payer: Self-pay | Admitting: Emergency Medicine

## 2022-10-09 DIAGNOSIS — R42 Dizziness and giddiness: Secondary | ICD-10-CM

## 2022-10-09 MED ORDER — FLUTICASONE PROPIONATE 50 MCG/ACT NA SUSP: 2.0000 | Freq: Every day | NASAL | 0 refills | Status: DC

## 2022-10-09 MED ORDER — MECLIZINE HCL 25 MG PO TABS: 25.0000 mg | ORAL_TABLET | Freq: Three times a day (TID) | ORAL | 0 refills | Status: AC | PRN

## 2022-10-09 NOTE — Discharge Instructions (Addendum)
Take medication as prescribed. Increase fluids. Try to drink at least 8 to 10 eight ounce glasses of water daily. May continue Pedialyte. Allow for plenty of rest. The medication prescribed today may cause drowsiness. Ibuprofen or Tylenol for any pain or discomfort. Rest today, avoid driving, sudden movements or bending while symptoms persist. Perform the exercises provided to help relieve your symptoms.  Go to the emergency department if you develop worsening dizziness, slurred speech, altered mental status, inability to walk or complete loss of coordination. Follow up with your primary care physician if your symptoms fail to improve. Follow-up as needed.

## 2022-10-09 NOTE — ED Triage Notes (Addendum)
Pt reports intermittent dizziness x2 days and reports constant since waking up this am. Pt noted to have unsteady gait ambulating to triage.denies any numbness/tingling in extremities. Pt alert and oriented x4. Speech clear, equal grips,facial symmetry. Pt reports "feels like room is spinning". Reports dizziness gets worse with position changes and reports sitting with eyes closed improves dizziness.

## 2022-10-09 NOTE — ED Provider Notes (Signed)
[facility]    CSN: [id number] Arrival date & time: 10/09/22  0957      History   Chief Complaint Chief Complaint  Patient presents with   Dizziness    Entered by patient    HPI Luke [name] is a 31 y.o. male.   The history is provided by the patient.   Patient presents for complaints of dizziness that been present over the past 2 days.  Patient states he feels like he is "spinning".  Patient states that symptoms worsen when he is bending forward, lying on his left side, with sudden movement, and when he lays his head back.  He states when he is sitting normally with his eyes closed, his symptoms seem to improve.  He states that when his symptoms initially started, he used Pedialyte, his symptoms improved, and he thought they were resolved.  Patient reports that he does have a history of vertigo, but states his symptoms are "vertigo on 10".  He denies headache, blurred vision, weakness, change in his mental status, slurred speech, lower extremity weakness, numbness, or tingling.  Patient states he has taken medicine for his vertigo in the past.  Patient reports that over the past several days, he has had nasal congestion.  He states that he did have ear pain approximately 2 weeks ago.  Past Medical History:  Diagnosis Date   Family history of adverse reaction to anesthesia    mother states is hard for her to wake up post-op   Ganglion cyst of wrist 07/2015   right   GERD (gastroesophageal reflux disease)    History of MRSA infection as a teenager   chin    Patient Active Problem List   Diagnosis Date Noted   Dyspepsia 01/06/2015    Past Surgical History:  Procedure Laterality Date   ESOPHAGOGASTRODUODENOSCOPY N/A 01/11/2015   SLF: 1. nausea and epigastric pain most likely due to GERD/ Gastritis 2. Mild Erosive gastritis   MASS EXCISION Right 08/05/2015   Procedure: RIGHT WRIST EXCISION MASS;  Surgeon: Betha Loa, MD;  Location: [facility];   Service: Orthopedics;  Laterality: Right;   TONSILLECTOMY AND ADENOIDECTOMY     WISDOM TOOTH EXTRACTION         Home Medications    Prior to Admission medications   Medication Sig Start Date End Date Taking? Authorizing Provider  fluticasone (FLONASE) 50 MCG/ACT nasal spray Place 2 sprays into both nostrils daily. 10/09/22  Yes Charmain Diosdado-Warren, Sadie Haber, NP  meclizine (ANTIVERT) 25 MG tablet Take 1 tablet (25 mg total) by mouth 3 (three) times daily as needed for dizziness. 10/09/22  Yes Farheen Pfahler-Warren, Sadie Haber, NP  doxycycline (VIBRA-TABS) 100 MG tablet Take 1 tablet (100 mg total) by mouth 2 (two) times daily. Patient not taking: Reported on 04/30/2020 01/05/17   Babs Sciara, MD  HYDROcodone-acetaminophen [facility]) 5-325 MG tablet 1-2 tabs po q6 hours prn pain Patient not taking: Reported on 12/31/2016 08/05/15   Betha Loa, MD  ibuprofen (ADVIL) 600 MG tablet Take 600 mg by mouth every 6 (six) hours as needed for headache, mild pain or moderate pain.  Patient not taking: Reported on 10/09/2022    [provider]  ondansetron (ZOFRAN) 8 MG tablet Take 1 tablet (8 mg total) by mouth every 8 (eight) hours as needed for nausea. Patient not taking: Reported on 12/31/2016 08/02/15   Babs Sciara, MD  pantoprazole (PROTONIX) 40 MG tablet Take 40 mg by mouth 2 (two) times daily.  Patient not taking: Reported on 04/30/2020    [provider]  pantoprazole (PROTONIX) 40 MG tablet Take 1 tablet (40 mg total) by mouth daily. Patient not taking: Reported on 04/30/2020 12/31/16 12/31/17  Mikey Kirschner, MD  promethazine (PHENERGAN) 25 MG tablet Take 1/2 tab to 1 tab BID PRN Patient not taking: Reported on 12/31/2016 01/06/16   Kathyrn Drown, MD    Family History Family History  Problem Relation Age of Onset   Ulcers Maternal Grandfather    Ulcers Maternal Grandmother    Esophagitis Mother     Social History Social History   Tobacco Use   Smoking status: Some Days    Types:  Cigars   Smokeless tobacco: Never  Vaping Use   Vaping Use: Never used  Substance Use Topics   Alcohol use: Yes    Alcohol/week: 0.0 standard drinks of alcohol    Comment: occasionally   Drug use: No     Allergies   Penicillins   Review of Systems Review of Systems Per HPI  Physical Exam Triage Vital Signs ED Triage Vitals  Enc Vitals Group     BP 10/09/22 1054 117/77     Pulse Rate 10/09/22 1054 62     Resp 10/09/22 1054 20     Temp 10/09/22 1054 98 F (36.7 C)     Temp Source 10/09/22 1054 Oral     SpO2 10/09/22 1054 99 %     Weight --      Height --      Head Circumference --      Peak Flow --      Pain Score 10/09/22 1052 0     Pain Loc --      Pain Edu? --      Excl. in [place]? --    Orthostatic VS for the past 24 hrs:  BP- Lying Pulse- Lying BP- Standing at 0 minutes Pulse- Standing at 0 minutes  10/09/22 1056 109/72 67 123/84 69    Updated Vital Signs BP 117/77 (BP Location: Right Arm)   Pulse 62   Temp 98 F (36.7 C) (Oral)   Resp 20   SpO2 99%   Visual Acuity Right Eye Distance:   Left Eye Distance:   Bilateral Distance:    Right Eye Near:   Left Eye Near:    Bilateral Near:     Physical Exam Vitals and nursing note reviewed.  Constitutional:      General: He is not in acute distress.    Appearance: Normal appearance.  HENT:     Head: Normocephalic.     Right Ear: Tympanic membrane, ear canal and external ear normal.     Left Ear: Tympanic membrane, ear canal and external ear normal.     Nose: Nose normal.     Mouth/Throat:     Mouth: Mucous membranes are moist.  Eyes:     Extraocular Movements: Extraocular movements intact.     Conjunctiva/sclera: Conjunctivae normal.     Pupils: Pupils are equal, round, and reactive to light.  Cardiovascular:     Rate and Rhythm: Regular rhythm.     Pulses: Normal pulses.     Heart sounds: Normal heart sounds.  Pulmonary:     Effort: Pulmonary effort is normal.     Breath sounds: Normal breath  sounds.  Abdominal:     General: Bowel sounds are normal.     Palpations: Abdomen is soft.     Tenderness: There is no  abdominal tenderness.  Musculoskeletal:     Cervical back: Normal range of motion.  Lymphadenopathy:     Cervical: No cervical adenopathy.  Skin:    General: Skin is warm and dry.  Neurological:     General: No focal deficit present.     Mental Status: He is alert and oriented to person, place, and time.     GCS: GCS eye subscore is 4. GCS verbal subscore is 5. GCS motor subscore is 6.     Cranial Nerves: Cranial nerves 2-12 are intact.     Sensory: Sensation is intact.     Motor: Motor function is intact.     Coordination: Finger-Nose-Finger Test normal.     Gait: Gait is intact.  Psychiatric:        Mood and Affect: Mood normal.        Behavior: Behavior normal.      UC Treatments / Results  Labs (all labs ordered are listed, but only abnormal results are displayed) Labs Reviewed - No data to display  EKG   Radiology No results found.  Procedures Procedures (including critical care time)  Medications Ordered in UC Medications - No data to display  Initial Impression / Assessment and Plan / UC Course  I have reviewed the triage vital signs and the nursing notes.  Pertinent labs & imaging results that were available during my care of the patient were reviewed by me and considered in my medical decision making (see chart for details).  Symptoms appear to be consistent with vertigo.  Neurological exam is within normal limits, patient's vital signs are stable, he is well-appearing, and is in no acute distress.  Orthostatic vitals were normal.  Will start patient on meclizine 25 mg.  Patient was also prescribed fluticasone to help with bilateral middle ear effusion.  Ported care recommendations were provided to the patient to continue to use Pedialyte as needed, allow for plenty of rest, and perform the Epley maneuvers provided.  Patient was given strict  indications of when to go to the emergency department.  Patient verbalizes understanding.  All questions were answered.  Patient is stable for discharge.  Work note was provided. Final Clinical Impressions(s) / UC Diagnoses   Final diagnoses:  Vertigo     Discharge Instructions      Take medication as prescribed. Increase fluids. Try to drink at least 8 to 10 eight ounce glasses of water daily. May continue Pedialyte. Allow for plenty of rest. The medication prescribed today may cause drowsiness. Ibuprofen or Tylenol for any pain or discomfort. Rest today, avoid driving, sudden movements or bending while symptoms persist. Perform the exercises provided to help relieve your symptoms.  Go to the emergency department if you develop worsening dizziness, slurred speech, altered mental status, inability to walk or complete loss of coordination. Follow up with your primary care physician if your symptoms fail to improve. Follow-up as needed.      ED Prescriptions     Medication Sig Dispense Auth. Provider   meclizine (ANTIVERT) 25 MG tablet Take 1 tablet (25 mg total) by mouth 3 (three) times daily as needed for dizziness. 30 tablet Ethelda Deangelo-Warren, Alda Lea, NP   fluticasone (FLONASE) 50 MCG/ACT nasal spray Place 2 sprays into both nostrils daily. 16 g Jaidy Cottam-Warren, Alda Lea, NP      PDMP not reviewed this encounter.   Tish Men, NP 10/09/22 1128

## 2023-07-07 DIAGNOSIS — Z419 Encounter for procedure for purposes other than remedying health state, unspecified: Secondary | ICD-10-CM | POA: Diagnosis not present

## 2024-08-23 ENCOUNTER — Encounter

## 2024-08-24 ENCOUNTER — Ambulatory Visit: Payer: Self-pay

## 2024-08-24 NOTE — Telephone Encounter (Signed)
 Patient/caller refused triage.  Reason for refusal: patient would not confirm his DOB and states he already spoke with Dr Alphonsa and declined offer for nurse triage or appointment scheduling. RN advised patient to call back for any future needs including scheduling, nurse triage or worsening symptoms.  Copied from CRM #[id number]. Topic: Clinical - Red Word Triage >> Aug 24, 2024  9:34 AM Shanda MATSU wrote: Red Word that prompted transfer to Nurse Triage: Patient reporting urge to urinate, but urine not coming out right away.  Reason for Disposition . Caller has already spoken with the doctor (or NP/PA, pharmacist) and has no further questions.  Protocols used: No Contact or Duplicate Contact Call-A-AH

## 2024-08-24 NOTE — Telephone Encounter (Signed)
 Patient disconnected before transferring to triage, will attempt to contact patient at a later time to discuss symptoms.                 Copied from CRM #[id number]. Topic: Clinical - Red Word Triage >> Aug 24, 2024  9:34 AM Shanda MATSU wrote: Red Word that prompted transfer to Nurse Triage: Patient reporting urge to urinate, but urine not coming out right away.

## 2024-09-02 ENCOUNTER — Ambulatory Visit: Admitting: Family Medicine

## 2024-09-02 ENCOUNTER — Encounter: Payer: Self-pay | Admitting: Family Medicine

## 2024-09-02 VITALS — BP 138/78 | HR 98 | Temp 98.4°F | Ht 69.0 in | Wt 151.0 lb

## 2024-09-02 DIAGNOSIS — Z13220 Encounter for screening for lipoid disorders: Secondary | ICD-10-CM | POA: Diagnosis not present

## 2024-09-02 DIAGNOSIS — N5089 Other specified disorders of the male genital organs: Secondary | ICD-10-CM

## 2024-09-02 NOTE — Progress Notes (Signed)
   Subjective:    Patient ID: Luke [name], male    DOB: [birth year], 33 y.o.   MRN: [id number]  HPI Re-establishing as a new pt  Urinary hesitancy a month ago in sept Lump behind left testicle not painful - dad had prostate ca 2 yrs ago Patient states that he noticed this approximately a week or 2 ago.  It alarmed him.  He came in to have it checked.  No pain with that no injury.  No change in sexual function.  Urination is actually doing well recently No abdominal pain no weight loss    Review of Systems     Objective:   Physical Exam  General-in no acute distress Eyes-no discharge Lungs-respiratory rate normal, CTA CV-no murmurs,RRR Extremities skin warm dry no edema Neuro grossly normal Behavior normal, alert Testicular exam reveals a hard nodule that is approximately 1 cm x 1 cm adjacent to the testicle it is hard to know if this is hard and growth versus cyst the likelihood of this being testicular cancer is lower but is not ruled out      Assessment & Plan:  1. Scrotal mass (Primary) Start off first with ultrasound based on this then may well need to move toward doing urology consult - CMP14+EGFR - CBC with Differential/Platelet - US  Scrotum  2. Screening, lipid Screening ordered - Lipid Profile

## 2024-09-03 ENCOUNTER — Other Ambulatory Visit: Payer: Self-pay

## 2024-09-03 DIAGNOSIS — N5089 Other specified disorders of the male genital organs: Secondary | ICD-10-CM

## 2024-09-04 ENCOUNTER — Ambulatory Visit (HOSPITAL_COMMUNITY)
Admission: RE | Admit: 2024-09-04 | Discharge: 2024-09-04 | Disposition: A | Discharge status: Home / Self Care | Source: Ambulatory Visit | Attending: Family Medicine | Admitting: Family Medicine

## 2024-09-04 DIAGNOSIS — N5089 Other specified disorders of the male genital organs: Secondary | ICD-10-CM | POA: Diagnosis not present

## 2024-09-04 DIAGNOSIS — N433 Hydrocele, unspecified: Secondary | ICD-10-CM | POA: Diagnosis not present

## 2024-09-07 ENCOUNTER — Telehealth: Payer: Self-pay | Admitting: Family Medicine

## 2024-09-07 ENCOUNTER — Other Ambulatory Visit: Payer: Self-pay | Admitting: Family Medicine

## 2024-09-07 ENCOUNTER — Ambulatory Visit: Payer: Self-pay | Admitting: Family Medicine

## 2024-09-07 MED ORDER — DOXYCYCLINE HYCLATE 100 MG PO CAPS: 100.0000 mg | ORAL_CAPSULE | Freq: Two times a day (BID) | ORAL | 0 refills | Status: AC

## 2024-09-07 NOTE — Telephone Encounter (Signed)
 Front staff Please set him up to do a follow-up with me in approximately 3 to 4 weeks same-day slot patient is aware please send him the appointment thank you  This is a follow-up on testicular issue

## 2024-10-06 ENCOUNTER — Ambulatory Visit: Admitting: Family Medicine

## 2024-10-06 ENCOUNTER — Encounter: Payer: Self-pay | Admitting: Family Medicine

## 2024-10-06 VITALS — BP 108/69 | HR 70 | Temp 98.1°F | Ht 69.0 in | Wt 155.0 lb

## 2024-10-06 DIAGNOSIS — N451 Epididymitis: Secondary | ICD-10-CM | POA: Diagnosis not present

## 2024-10-06 NOTE — Progress Notes (Unsigned)
   Subjective:    Patient ID: Authur DELENA Spry, male    DOB: 1991/08/21, 33 y.o.   MRN: [id number]  HPI  Follow up -scrotal mass  Patient had an area that was causing some intermittent discomfort He is not having any worrisome findings currently he states that the area is actually doing better after using the doxycycline  He did state he had an unusual rash with taking the doxycycline  on the left side of his neck but that is gone away there was no hives Denies any testicular pain Ultrasound did not show any testicular growths just epididymal area enlarged  Review of Systems     Objective:   Physical Exam  On physical exam lungs are clear hearts regular testicular area left side does have enlarged epididymis on the backside of the testicle but there is no masses felt in the testicle itself      Assessment & Plan:  Epididymitis-resolving-no further antibiotics-recheck in 6 months-reassuring that it is not testicular cancer Will send note to urology to get their input regarding this Patient was encouraged to do his lab work somewhere in the next couple months Follow-up in approximately 6 months

## 2024-10-13 ENCOUNTER — Telehealth: Payer: Self-pay | Admitting: Family Medicine

## 2024-10-13 NOTE — Telephone Encounter (Signed)
 Nurses Please connect with patient Let him know that I did communicate with urology They recommend that we recheck him again in mid February Depending on if the nodule is present or not present urology may need to see him.  Should go away by the time we recheck him but nonetheless it is best to be on the safe side Work with patient and may give him a same-day slot with myself in mid February thank you See below  McKenzie, Belvie CROME, MD  Alphonsa Glendia LABOR, MD Yes the nodule should resolve  in the next 2-3 months and he should see us  if the nodule does not resolve

## 2024-10-14 NOTE — Telephone Encounter (Signed)
 Patient notified of provider's recommendations and scheduled follow up office visit in mid February.

## 2024-12-16 ENCOUNTER — Ambulatory Visit: Admitting: Family Medicine

## 2025-04-06 ENCOUNTER — Ambulatory Visit: Admitting: Family Medicine
